# Patient Record
Sex: Female | Born: 1950 | Race: White | Hispanic: No | Marital: Married | Smoking: Never smoker
Health system: Southern US, Community
[De-identification: ages and names within clinical notes are randomized; demographics above are authoritative.]

## PROBLEM LIST (undated history)

## (undated) DIAGNOSIS — R002 Palpitations: Secondary | ICD-10-CM

## (undated) DIAGNOSIS — I10 Essential (primary) hypertension: Secondary | ICD-10-CM

## (undated) DIAGNOSIS — K219 Gastro-esophageal reflux disease without esophagitis: Secondary | ICD-10-CM

## (undated) DIAGNOSIS — H6993 Unspecified Eustachian tube disorder, bilateral: Secondary | ICD-10-CM

## (undated) DIAGNOSIS — H6983 Other specified disorders of Eustachian tube, bilateral: Secondary | ICD-10-CM

## (undated) DIAGNOSIS — T7840XA Allergy, unspecified, initial encounter: Secondary | ICD-10-CM

## (undated) DIAGNOSIS — I6521 Occlusion and stenosis of right carotid artery: Secondary | ICD-10-CM

## (undated) DIAGNOSIS — J45909 Unspecified asthma, uncomplicated: Secondary | ICD-10-CM

## (undated) DIAGNOSIS — E785 Hyperlipidemia, unspecified: Secondary | ICD-10-CM

## (undated) HISTORY — DX: Gastro-esophageal reflux disease without esophagitis: K21.9

## (undated) HISTORY — DX: Other specified disorders of eustachian tube, bilateral: H69.83

## (undated) HISTORY — DX: Unspecified eustachian tube disorder, bilateral: H69.93

## (undated) HISTORY — DX: Essential (primary) hypertension: I10

## (undated) HISTORY — DX: Unspecified asthma, uncomplicated: J45.909

## (undated) HISTORY — DX: Hyperlipidemia, unspecified: E78.5

## (undated) HISTORY — DX: Palpitations: R00.2

## (undated) HISTORY — DX: Allergy, unspecified, initial encounter: T78.40XA

## (undated) HISTORY — DX: Occlusion and stenosis of right carotid artery: I65.21

---

## 2013-08-22 ENCOUNTER — Other Ambulatory Visit (HOSPITAL_COMMUNITY): Payer: Self-pay | Admitting: Family Medicine

## 2013-08-22 ENCOUNTER — Ambulatory Visit (HOSPITAL_COMMUNITY)
Admission: RE | Admit: 2013-08-22 | Discharge: 2013-08-22 | Disposition: A | Discharge status: Home / Self Care | Payer: 59 | Source: Ambulatory Visit | Attending: Vascular Surgery | Admitting: Vascular Surgery

## 2013-08-22 DIAGNOSIS — I6529 Occlusion and stenosis of unspecified carotid artery: Secondary | ICD-10-CM

## 2013-08-22 DIAGNOSIS — I658 Occlusion and stenosis of other precerebral arteries: Secondary | ICD-10-CM | POA: Insufficient documentation

## 2013-08-23 ENCOUNTER — Other Ambulatory Visit (HOSPITAL_COMMUNITY): Payer: Self-pay | Admitting: Family Medicine

## 2015-09-11 DIAGNOSIS — H6980 Other specified disorders of Eustachian tube, unspecified ear: Secondary | ICD-10-CM | POA: Diagnosis present

## 2015-09-11 DIAGNOSIS — E785 Hyperlipidemia, unspecified: Secondary | ICD-10-CM | POA: Diagnosis present

## 2015-09-11 DIAGNOSIS — J309 Allergic rhinitis, unspecified: Secondary | ICD-10-CM | POA: Diagnosis present

## 2019-02-15 ENCOUNTER — Encounter: Payer: Self-pay | Admitting: Gastroenterology

## 2020-10-02 ENCOUNTER — Ambulatory Visit (AMBULATORY_SURGERY_CENTER): Payer: 59

## 2020-10-02 ENCOUNTER — Other Ambulatory Visit: Payer: Self-pay

## 2020-10-02 VITALS — Ht 62.5 in | Wt 176.0 lb

## 2020-10-02 DIAGNOSIS — Z800 Family history of malignant neoplasm of digestive organs: Secondary | ICD-10-CM

## 2020-10-02 NOTE — Progress Notes (Signed)

## 2020-10-03 ENCOUNTER — Encounter: Payer: Self-pay | Admitting: Gastroenterology

## 2020-10-16 ENCOUNTER — Ambulatory Visit (AMBULATORY_SURGERY_CENTER): Payer: 59 | Admitting: Gastroenterology

## 2020-10-16 ENCOUNTER — Other Ambulatory Visit: Payer: Self-pay

## 2020-10-16 ENCOUNTER — Encounter: Payer: Self-pay | Admitting: Gastroenterology

## 2020-10-16 VITALS — BP 406/57 | HR 74 | Temp 97.1°F | Resp 13 | Ht 62.5 in | Wt 176.0 lb

## 2020-10-16 DIAGNOSIS — Z1211 Encounter for screening for malignant neoplasm of colon: Secondary | ICD-10-CM | POA: Diagnosis not present

## 2020-10-16 DIAGNOSIS — Z800 Family history of malignant neoplasm of digestive organs: Secondary | ICD-10-CM

## 2020-10-16 MED ORDER — SODIUM CHLORIDE 0.9 % IV SOLN: 500.0000 mL | Freq: Once | INTRAVENOUS | Status: DC

## 2020-10-16 NOTE — Progress Notes (Signed)
Pt's states no medical or surgical changes since previsit or office visit.  VS taken by HC 

## 2020-10-16 NOTE — Progress Notes (Signed)
Report to PACU, RN, vss, BBS= Clear.  

## 2020-10-16 NOTE — Op Note (Signed)
[facility] Patient Name: Brenda Morse Procedure Date: 10/16/2020 9:19 AM MRN: [id number] Endoscopist: Lynann Bologna , MD Age: 70 Referring MD:  Date of Birth: 01-05-1951 Gender: Female Account #: [id number] Procedure:                Colonoscopy Indications:              Screening in patient at increased risk: Colorectal                            cancer in brother at age 56. Medicines:                Monitored Anesthesia Care Procedure:                Pre-Anesthesia Assessment:                           - Prior to the procedure, a History and Physical                            was performed, and patient medications and                            allergies were reviewed. The patient's tolerance of                            previous anesthesia was also reviewed. The risks                            and benefits of the procedure and the sedation                            options and risks were discussed with the patient.                            All questions were answered, and informed consent                            was obtained. Prior Anticoagulants: The patient has                            taken no previous anticoagulant or antiplatelet                            agents. ASA Grade Assessment: II - A patient with                            mild systemic disease. After reviewing the risks                            and benefits, the patient was deemed in                            satisfactory condition to undergo the procedure.  After obtaining informed consent, the colonoscope                            was passed under direct vision. Throughout the                            procedure, the patient's blood pressure, pulse, and                            oxygen saturations were monitored continuously. The                            Olympus [id number]-[id number]) Colonoscope was                            introduced through the anus and  advanced to the the                            cecum, identified by appendiceal orifice and                            ileocecal valve. The colonoscopy was performed                            without difficulty. The patient tolerated the                            procedure well. The quality of the bowel                            preparation was good. The ileocecal valve,                            appendiceal orifice, and rectum were photographed. Scope In: 9:26:00 AM Scope Out: 9:39:36 AM Scope Withdrawal Time: 0 hours 9 minutes 40 seconds  Total Procedure Duration: 0 hours 13 minutes 36 seconds  Findings:                 Multiple medium-mouthed diverticula were found in                            the sigmoid colon, descending colon, transverse                            colon, ascending colon and few in cecum.                           A single small angiodysplastic lesion without                            bleeding was found in the cecum.                           Non-bleeding internal hemorrhoids were found during  retroflexion. The hemorrhoids were small.                           The exam was otherwise without abnormality on                            direct and retroflexion views. Complications:            No immediate complications. Estimated Blood Loss:     Estimated blood loss: none. Impression:               - Moderate pancolonic diverticulosis.                           - A single non-bleeding incidental colonic AVM.                           - Non-bleeding internal hemorrhoids.                           - The examination was otherwise normal on direct                            and retroflexion views.                           - No specimens collected. Recommendation:           - Patient has a contact number available for                            emergencies. The signs and symptoms of potential                            delayed complications  were discussed with the                            patient. Return to normal activities tomorrow.                            Written discharge instructions were provided to the                            patient.                           - High fiber diet.                           - Continue present medications.                           - Repeat colonoscopy is not recommended for                            screening purposes. Hence repeat colonoscopy only  if with any new problems or change in family                            history.                           - Return to GI office PRN.                           - The findings and recommendations were discussed                            with patient's friend Vernona Rieger. Lynann Bologna, MD 10/16/2020 9:46:20 AM This report has been signed electronically.

## 2020-10-16 NOTE — Patient Instructions (Signed)
Information on hemorrhoids and diverticulosis given to you today.  Resume previous diet and medications and be sure to eat plenty of fiber.  YOU HAD AN ENDOSCOPIC PROCEDURE TODAY AT THE [facility]:   Refer to the procedure report that was given to you for any specific questions about what was found during the examination.  If the procedure report does not answer your questions, please call your gastroenterologist to clarify.  If you requested that your care partner not be given the details of your procedure findings, then the procedure report has been included in a sealed envelope for you to review at your convenience later.  YOU SHOULD EXPECT: Some feelings of bloating in the abdomen. Passage of more gas than usual.  Walking can help get rid of the air that was put into your GI tract during the procedure and reduce the bloating. If you had a lower endoscopy (such as a colonoscopy or flexible sigmoidoscopy) you may notice spotting of blood in your stool or on the toilet paper. If you underwent a bowel prep for your procedure, you may not have a normal bowel movement for a few days.  Please Note:  You might notice some irritation and congestion in your nose or some drainage.  This is from the oxygen used during your procedure.  There is no need for concern and it should clear up in a day or so.  SYMPTOMS TO REPORT IMMEDIATELY:   Following lower endoscopy (colonoscopy or flexible sigmoidoscopy):  Excessive amounts of blood in the stool  Significant tenderness or worsening of abdominal pains  Swelling of the abdomen that is new, acute  Fever of 100F or higher  For urgent or emergent issues, a gastroenterologist can be reached at any hour by calling [phone]. Do not use MyChart messaging for urgent concerns.    DIET:  We do recommend a small meal at first, but then you may proceed to your regular diet.  Drink plenty of fluids but you should avoid alcoholic beverages for 24  hours.  ACTIVITY:  You should plan to take it easy for the rest of today and you should NOT DRIVE or use heavy machinery until tomorrow (because of the sedation medicines used during the test).    FOLLOW UP: Our staff will call the number listed on your records 48-72 hours following your procedure to check on you and address any questions or concerns that you may have regarding the information given to you following your procedure. If we do not reach you, we will leave a message.  We will attempt to reach you two times.  During this call, we will ask if you have developed any symptoms of COVID 19. If you develop any symptoms (ie: fever, flu-like symptoms, shortness of breath, cough etc.) before then, please call [phone].  If you test positive for Covid 19 in the 2 weeks post procedure, please call and report this information to [place].    If any biopsies were taken you will be contacted by phone or by letter within the next 1-3 weeks.  Please call us at [phone] if you have not heard about the biopsies in 3 weeks.    SIGNATURES/CONFIDENTIALITY: You and/or your care partner have signed paperwork which will be entered into your electronic medical record.  These signatures attest to the fact that that the information above on your After Visit Summary has been reviewed and is understood.  Full responsibility of the confidentiality of this discharge information lies  with you and/or your care-partner.

## 2020-10-20 ENCOUNTER — Telehealth: Payer: Self-pay

## 2020-10-20 NOTE — Telephone Encounter (Signed)
LVM

## 2021-01-07 ENCOUNTER — Telehealth: Payer: Self-pay | Admitting: Gastroenterology

## 2021-01-07 NOTE — Telephone Encounter (Signed)
Patient calling to inform she is experiencing diarrhea. Pt states she has been having sxs for 2 weeks. Pt wants to know how to treat sxs.. Plz advise   Thanks

## 2021-01-07 NOTE — Telephone Encounter (Signed)
Not too sure what is going on She is not a complainer -Lets check stool studies for GI pathogens, C. difficile and calprotectin -Check CBC, CMP, CRP, celiac serology and TSH -Can she be seen in app clinic or my clinic, whichever we can get her sooner RG

## 2021-01-07 NOTE — Telephone Encounter (Signed)
Pt states she has been having diarrhea for the past 2 weeks. She is taking pepto and imodium, has no appetite, has acid reflux coming up in her throat and reports she feels terrible. States she has not been on any antibiotics recently. Please advise.

## 2021-01-08 ENCOUNTER — Encounter (HOSPITAL_COMMUNITY): Payer: Self-pay | Admitting: Internal Medicine

## 2021-01-08 ENCOUNTER — Inpatient Hospital Stay (HOSPITAL_COMMUNITY)
Admission: AD | Admit: 2021-01-08 | Discharge: 2021-01-15 | DRG: 871 | Disposition: A | Discharge status: Home Health | Payer: 59 | Source: Other Acute Inpatient Hospital | Attending: Internal Medicine | Admitting: Internal Medicine

## 2021-01-08 ENCOUNTER — Inpatient Hospital Stay: Admission: AD | Admit: 2021-01-08 | Payer: 59 | Source: Other Acute Inpatient Hospital | Admitting: Internal Medicine

## 2021-01-08 ENCOUNTER — Other Ambulatory Visit: Payer: Self-pay

## 2021-01-08 DIAGNOSIS — Z20822 Contact with and (suspected) exposure to covid-19: Secondary | ICD-10-CM | POA: Diagnosis present

## 2021-01-08 DIAGNOSIS — I081 Rheumatic disorders of both mitral and tricuspid valves: Secondary | ICD-10-CM | POA: Diagnosis present

## 2021-01-08 DIAGNOSIS — A0472 Enterocolitis due to Clostridium difficile, not specified as recurrent: Secondary | ICD-10-CM | POA: Diagnosis present

## 2021-01-08 DIAGNOSIS — Z90711 Acquired absence of uterus with remaining cervical stump: Secondary | ICD-10-CM | POA: Diagnosis not present

## 2021-01-08 DIAGNOSIS — R160 Hepatomegaly, not elsewhere classified: Secondary | ICD-10-CM | POA: Diagnosis present

## 2021-01-08 DIAGNOSIS — K750 Abscess of liver: Secondary | ICD-10-CM | POA: Diagnosis present

## 2021-01-08 DIAGNOSIS — Z79899 Other long term (current) drug therapy: Secondary | ICD-10-CM | POA: Diagnosis not present

## 2021-01-08 DIAGNOSIS — N179 Acute kidney failure, unspecified: Secondary | ICD-10-CM | POA: Diagnosis present

## 2021-01-08 DIAGNOSIS — I2721 Secondary pulmonary arterial hypertension: Secondary | ICD-10-CM | POA: Diagnosis present

## 2021-01-08 DIAGNOSIS — I10 Essential (primary) hypertension: Secondary | ICD-10-CM | POA: Diagnosis present

## 2021-01-08 DIAGNOSIS — G250 Essential tremor: Secondary | ICD-10-CM | POA: Diagnosis present

## 2021-01-08 DIAGNOSIS — H6990 Unspecified Eustachian tube disorder, unspecified ear: Secondary | ICD-10-CM | POA: Diagnosis present

## 2021-01-08 DIAGNOSIS — G9341 Metabolic encephalopathy: Secondary | ICD-10-CM | POA: Diagnosis present

## 2021-01-08 DIAGNOSIS — Z8711 Personal history of peptic ulcer disease: Secondary | ICD-10-CM | POA: Diagnosis not present

## 2021-01-08 DIAGNOSIS — R7989 Other specified abnormal findings of blood chemistry: Secondary | ICD-10-CM | POA: Diagnosis present

## 2021-01-08 DIAGNOSIS — R197 Diarrhea, unspecified: Secondary | ICD-10-CM

## 2021-01-08 DIAGNOSIS — K219 Gastro-esophageal reflux disease without esophagitis: Secondary | ICD-10-CM | POA: Diagnosis present

## 2021-01-08 DIAGNOSIS — Z880 Allergy status to penicillin: Secondary | ICD-10-CM | POA: Diagnosis not present

## 2021-01-08 DIAGNOSIS — R7881 Bacteremia: Secondary | ICD-10-CM | POA: Diagnosis not present

## 2021-01-08 DIAGNOSIS — Z800 Family history of malignant neoplasm of digestive organs: Secondary | ICD-10-CM

## 2021-01-08 DIAGNOSIS — R4182 Altered mental status, unspecified: Secondary | ICD-10-CM | POA: Diagnosis not present

## 2021-01-08 DIAGNOSIS — H6980 Other specified disorders of Eustachian tube, unspecified ear: Secondary | ICD-10-CM | POA: Diagnosis present

## 2021-01-08 DIAGNOSIS — E871 Hypo-osmolality and hyponatremia: Secondary | ICD-10-CM | POA: Diagnosis present

## 2021-01-08 DIAGNOSIS — J309 Allergic rhinitis, unspecified: Secondary | ICD-10-CM | POA: Diagnosis present

## 2021-01-08 DIAGNOSIS — A408 Other streptococcal sepsis: Principal | ICD-10-CM | POA: Diagnosis present

## 2021-01-08 DIAGNOSIS — E785 Hyperlipidemia, unspecified: Secondary | ICD-10-CM | POA: Diagnosis present

## 2021-01-08 DIAGNOSIS — I340 Nonrheumatic mitral (valve) insufficiency: Secondary | ICD-10-CM | POA: Diagnosis not present

## 2021-01-08 DIAGNOSIS — I361 Nonrheumatic tricuspid (valve) insufficiency: Secondary | ICD-10-CM | POA: Diagnosis not present

## 2021-01-08 LAB — CBC
HCT: 29.3 % — ABNORMAL LOW (ref 36.0–46.0)
Hemoglobin: 10.2 g/dL — ABNORMAL LOW (ref 12.0–15.0)
MCH: 28.7 pg (ref 26.0–34.0)
MCHC: 34.8 g/dL (ref 30.0–36.0)
MCV: 82.3 fL (ref 80.0–100.0)
Platelets: 141 10*3/uL — ABNORMAL LOW (ref 150–400)
RBC: 3.56 MIL/uL — ABNORMAL LOW (ref 3.87–5.11)
RDW: 13.2 % (ref 11.5–15.5)
WBC: 17.8 10*3/uL — ABNORMAL HIGH (ref 4.0–10.5)
nRBC: 0 % (ref 0.0–0.2)

## 2021-01-08 LAB — CREATININE, SERUM
Creatinine, Ser: 2.56 mg/dL — ABNORMAL HIGH (ref 0.44–1.00)
GFR, Estimated: 20 mL/min — ABNORMAL LOW (ref >60)

## 2021-01-08 MED ORDER — VANCOMYCIN HCL 1250 MG/250ML IV SOLN
1250.0000 mg | Freq: Once | INTRAVENOUS | Status: AC
  Administered 2021-01-08: 1250 mg via INTRAVENOUS
  Filled 2021-01-08: qty 250

## 2021-01-08 MED ORDER — ONDANSETRON HCL 4 MG PO TABS: 4.0000 mg | ORAL_TABLET | Freq: Four times a day (QID) | ORAL | Status: DC | PRN

## 2021-01-08 MED ORDER — MORPHINE SULFATE (PF) 2 MG/ML IV SOLN: 2.0000 mg | INTRAVENOUS | Status: DC | PRN

## 2021-01-08 MED ORDER — ENOXAPARIN SODIUM 30 MG/0.3ML IJ SOSY
30.0000 mg | PREFILLED_SYRINGE | INTRAMUSCULAR | Status: DC
  Administered 2021-01-09 – 2021-01-10 (×2): 30 mg via SUBCUTANEOUS
  Filled 2021-01-08 (×2): qty 0.3

## 2021-01-08 MED ORDER — SODIUM CHLORIDE 0.45 % IV SOLN
INTRAVENOUS | Status: DC
  Administered 2021-01-08: 100 mL via INTRAVENOUS

## 2021-01-08 MED ORDER — SODIUM CHLORIDE 0.9 % IV SOLN
2.0000 g | INTRAVENOUS | Status: AC
  Administered 2021-01-09: 2 g via INTRAVENOUS
  Filled 2021-01-08 (×2): qty 2

## 2021-01-08 MED ORDER — ONDANSETRON HCL 4 MG/2ML IJ SOLN
4.0000 mg | Freq: Four times a day (QID) | INTRAMUSCULAR | Status: DC | PRN
  Administered 2021-01-13 – 2021-01-15 (×4): 4 mg via INTRAVENOUS
  Filled 2021-01-08 (×5): qty 2

## 2021-01-08 NOTE — Telephone Encounter (Signed)
Called to speak to pt and her friend answered the phone and said pt was in the bathroom. Reports she has not been able to eat much of anything and she is shaking all over. She is going to take her to [facility] ER to be evaluated. Dr. Chales Abrahams notified.

## 2021-01-08 NOTE — H&P (Signed)
History and Physical   Brenda Morse CBS:[id number] DOB: 02/16/1951 DOA: 01/08/2021  Referring MD/NP/PA: Dr. Genevie Cheshire, from [place]  PCP: Etter Sjogren Joline Salt, PA-C   Outpatient Specialists: Dr. Chales Abrahams, gastroenterologist  Patient coming from: [facility]  Chief Complaint: Diarrhea and fever  HPI: Brenda Morse is a 70 y.o. female with medical history significant of  significant of diverticular disease, GERD, Hashimoto's thyroiditis, thrombocytopenia, essential tremor, chronic interstitial cystitis, history of gastric ulcer and gastritis, irritable bowel syndrome, B12 deficiency, who was seen at [facility] with acute vasculopathy sepsis elevated for work-up.  Patient was seen by medical service.  Consultation.  She apparently has been having profuse diarrhea up to 40 times a day with nausea but no vomiting.  This been going on for over 2 weeks.  Denied any significant abdominal pain.  No urinary symptoms.  Patient had colonoscopy about 2 and half months ago by Dr. Chales Abrahams Nothing was seen at the time except for diverticular disease.  But she came in today with fever and chills.  COVID-19 was negative.  Patient was evaluated and found to have alert several centimeter mass in the liver with another small liver mass.  Also elevated LFTs concerning for abscess versus malignancy.  Patient had a white count of 23,000.  Temperature 1.3.  She also had urinalysis that is nondiagnostic.  Lactic acid was 1.3 magnesium 2.1.  Tylenol level was negative.  Her AST was 30-55 ALT 1481 total bilirubin 2.3.  She is also in acute kidney injury with creatinine 2.10 glucose 163 BUN 27.  Her hemoglobin was found to be 10.9.  Based on her findings patient is suspected to have liver abscess.  She was therefore transferred here for further evaluation and treatment.  She has underlying history of depression as well but appears stable here.  She reported no diarrhea since arrival here at [facility] long.  Also no nausea  or vomiting at this point.  Patient is being admitted with expected GI follow-up..  ED Course: Refer to notes above  Review of Systems: As per HPI otherwise 10 point review of systems negative.    Past Medical History:  Diagnosis Date   Allergy    Asthma    when allergies flare   GERD (gastroesophageal reflux disease)    Hypertension     Past Surgical History:  Procedure Laterality Date   PARTIAL HYSTERECTOMY  1986   TONSILLECTOMY AND ADENOIDECTOMY Bilateral    age 56     reports that she has never smoked. She has never used smokeless tobacco. She reports previous alcohol use. She reports that she does not use drugs.  Allergies  Allergen Reactions   Penicillins Itching    Family History  Problem Relation Age of Onset   Colon cancer Brother 79   Rectal cancer Brother    Esophageal cancer Neg Hx    Stomach cancer Neg Hx      Prior to Admission medications   Medication Sig Start Date End Date Taking? Authorizing Provider  atenolol (TENORMIN) 25 MG tablet Take 1 tablet by mouth daily. 07/10/20   [provider]  cetirizine (ZYRTEC) 10 MG tablet Take by mouth.    [provider]  DM-APAP-CPM (CORICIDIN HBP) 10-325-2 MG TABS Take by mouth.    [provider]  fluticasone (FLONASE) 50 MCG/ACT nasal spray Place into the nose.    [provider]  hydrochlorothiazide (HYDRODIURIL) 12.5 MG tablet Take 1 tablet by mouth daily. 07/10/20   [provider]  montelukast (SINGULAIR) 10 MG tablet TAKE 1 TABLET BY MOUTH EVERY DAY AT NIGHT 07/10/20   [provider]  Multiple Vitamin (MULTIVITAMIN ADULT PO) Take by mouth.    [provider]    Physical Exam: Vitals:   01/08/21 2025  BP: (!) 107/58  Pulse: 70  Resp: 16  Temp: 97.6 F (36.4 C)  TempSrc: Oral  SpO2: 93%      Constitutional: NAD, calm, comfortable Vitals:   01/08/21 2025  BP: (!) 107/58  Pulse: 70  Resp: 16  Temp: 97.6 F (36.4 C)  TempSrc:  Oral  SpO2: 93%   Eyes: PERRL, lids and conjunctivae normal ENMT: Mucous membranes are moist. Posterior pharynx clear of any exudate or lesions.Normal dentition.  Neck: normal, supple, no masses, no thyromegaly Respiratory: clear to auscultation bilaterally, no wheezing, no crackles. Normal respiratory effort. No accessory muscle use.  Cardiovascular: Regular rate and rhythm, no murmurs / rubs / gallops. No extremity edema. 2+ pedal pulses. No carotid bruits.  Abdomen: Mild epigastric tenderness, no masses palpated. No hepatosplenomegaly. Bowel sounds positive.  Musculoskeletal: no clubbing / cyanosis. No joint deformity upper and lower extremities. Good ROM, no contractures. Normal muscle tone.  Skin: no rashes, lesions, ulcers. No induration Neurologic: CN 2-12 grossly intact. Sensation intact, DTR normal. Strength 5/5 in all 4.  Psychiatric: Normal judgment and insight. Alert and oriented x 3. Normal mood.     Labs on Admission: I have personally reviewed following labs and imaging studies  CBC: No results for input(s): WBC, NEUTROABS, HGB, HCT, MCV, PLT in the last 168 hours. Basic Metabolic Panel: No results for input(s): NA, K, CL, CO2, GLUCOSE, BUN, CREATININE, CALCIUM, MG, PHOS in the last 168 hours. GFR: CrCl cannot be calculated (No successful lab value found.). Liver Function Tests: No results for input(s): AST, ALT, ALKPHOS, BILITOT, PROT, ALBUMIN in the last 168 hours. No results for input(s): LIPASE, AMYLASE in the last 168 hours. No results for input(s): AMMONIA in the last 168 hours. Coagulation Profile: No results for input(s): INR, PROTIME in the last 168 hours. Cardiac Enzymes: No results for input(s): CKTOTAL, CKMB, CKMBINDEX, TROPONINI in the last 168 hours. BNP (last 3 results) No results for input(s): PROBNP in the last 8760 hours. HbA1C: No results for input(s): HGBA1C in the last 72 hours. CBG: No results for input(s): GLUCAP in the last 168 hours. Lipid  Profile: No results for input(s): CHOL, HDL, LDLCALC, TRIG, CHOLHDL, LDLDIRECT in the last 72 hours. Thyroid Function Tests: No results for input(s): TSH, T4TOTAL, FREET4, T3FREE, THYROIDAB in the last 72 hours. Anemia Panel: No results for input(s): VITAMINB12, FOLATE, FERRITIN, TIBC, IRON, RETICCTPCT in the last 72 hours. Urine analysis: No results found for: COLORURINE, APPEARANCEUR, LABSPEC, PHURINE, GLUCOSEU, HGBUR, BILIRUBINUR, KETONESUR, PROTEINUR, UROBILINOGEN, NITRITE, LEUKOCYTESUR Sepsis Labs: @LABRCNTIP (procalcitonin:4,lacticidven:4) )No results found for this or any previous visit (from the past 240 hour(s)).   Radiological Exams on Admission: No results found.    Assessment/Plan Principal Problem:   Liver mass Active Problems:   Allergic rhinitis   ETD (eustachian tube dysfunction)   Hyperlipidemia     #1 suspected liver abscess versus mass: Patient transferred with a suspicion of liver abscess especially with a fever and leukocytosis.  Differentials very.  GI was consulted from [place].  We will initiate empiric Vanco and cefepime.  She probably will need something like Flagyl.  Ultimately further imaging is required including possible MR or CT-guided aspiration versus biopsy.  In the meantime continue empiric antibiotics.  Blood  cultures already obtained and will follow results.  #2 hyperlipidemia: Hold her statin and other medicines due to ongoing liver disease.  #3 Allergic Rhinitis: Patient takes Zyrtec at home.  We will continue.  #4 essential hypertension: On atenolol and hydrochlorothiazide.  Continue the atenolol  #5 history of ETD: Continue with the cetirizine, Afrin, Singulair   DVT prophylaxis: Lovenox Code Status: Full code Family Communication: No family at bedside Disposition Plan: Home Consults called: Consult Dr. Chales Abrahams in the morning for GI Admission status: Inpatient  Severity of Illness: The appropriate patient status for this patient is  INPATIENT. Inpatient status is judged to be reasonable and necessary in order to provide the required intensity of service to ensure the patient's safety. The patient's presenting symptoms, physical exam findings, and initial radiographic and laboratory data in the context of their chronic comorbidities is felt to place them at high risk for further clinical deterioration. Furthermore, it is not anticipated that the patient will be medically stable for discharge from the hospital within 2 midnights of admission. The following factors support the patient status of inpatient.   " The patient's presenting symptoms include diarrhea. " The worrisome physical exam findings include mild epigastric tenderness. " The initial radiographic and laboratory data are worrisome because of about 7 cm mass in the liver. " The chronic co-morbidities include essential hypertension.   * I certify that at the point of admission it is my clinical judgment that the patient will require inpatient hospital care spanning beyond 2 midnights from the point of admission due to high intensity of service, high risk for further deterioration and high frequency of surveillance required.Lonia Blood MD [facility] Pager [phone]  If 7PM-7AM, please contact night-coverage [email or web address] Password [facility]  01/08/2021, 8:39 PM

## 2021-01-08 NOTE — Telephone Encounter (Signed)
Agree with plan RG 

## 2021-01-09 ENCOUNTER — Inpatient Hospital Stay (HOSPITAL_COMMUNITY): Payer: 59

## 2021-01-09 DIAGNOSIS — R160 Hepatomegaly, not elsewhere classified: Secondary | ICD-10-CM

## 2021-01-09 LAB — BASIC METABOLIC PANEL
Anion gap: 16 — ABNORMAL HIGH (ref 5–15)
Anion gap: 11 (ref 5–15)
BUN: 41 mg/dL — ABNORMAL HIGH (ref 8–23)
BUN: 41 mg/dL — ABNORMAL HIGH (ref 8–23)
CO2: 22 mmol/L (ref 22–32)
CO2: 20 mmol/L — ABNORMAL LOW (ref 22–32)
Calcium: 7.5 mg/dL — ABNORMAL LOW (ref 8.9–10.3)
Calcium: 7.4 mg/dL — ABNORMAL LOW (ref 8.9–10.3)
Chloride: 88 mmol/L — ABNORMAL LOW (ref 98–111)
Chloride: 98 mmol/L (ref 98–111)
Creatinine, Ser: 2.68 mg/dL — ABNORMAL HIGH (ref 0.44–1.00)
Creatinine, Ser: 2.02 mg/dL — ABNORMAL HIGH (ref 0.44–1.00)
GFR, Estimated: 19 mL/min — ABNORMAL LOW (ref >60)
GFR, Estimated: 26 mL/min — ABNORMAL LOW (ref >60)
Glucose, Bld: 84 mg/dL (ref 70–99)
Glucose, Bld: 174 mg/dL — ABNORMAL HIGH (ref 70–99)
Potassium: 2.9 mmol/L — ABNORMAL LOW (ref 3.5–5.1)
Potassium: 3.3 mmol/L — ABNORMAL LOW (ref 3.5–5.1)
Sodium: 126 mmol/L — ABNORMAL LOW (ref 135–145)
Sodium: 129 mmol/L — ABNORMAL LOW (ref 135–145)

## 2021-01-09 LAB — AMMONIA: Ammonia: 23 umol/L (ref 9–35)

## 2021-01-09 LAB — MRSA PCR SCREENING: MRSA by PCR: NEGATIVE

## 2021-01-09 LAB — COMPREHENSIVE METABOLIC PANEL
ALT: 897 U/L — ABNORMAL HIGH (ref 0–44)
AST: 795 U/L — ABNORMAL HIGH (ref 15–41)
Albumin: 2.4 g/dL — ABNORMAL LOW (ref 3.5–5.0)
Alkaline Phosphatase: 118 U/L (ref 38–126)
Anion gap: 16 — ABNORMAL HIGH (ref 5–15)
BUN: 42 mg/dL — ABNORMAL HIGH (ref 8–23)
CO2: 19 mmol/L — ABNORMAL LOW (ref 22–32)
Calcium: 7.5 mg/dL — ABNORMAL LOW (ref 8.9–10.3)
Chloride: 91 mmol/L — ABNORMAL LOW (ref 98–111)
Creatinine, Ser: 2.87 mg/dL — ABNORMAL HIGH (ref 0.44–1.00)
GFR, Estimated: 17 mL/min — ABNORMAL LOW (ref >60)
Glucose, Bld: 96 mg/dL (ref 70–99)
Potassium: 3.2 mmol/L — ABNORMAL LOW (ref 3.5–5.1)
Sodium: 126 mmol/L — ABNORMAL LOW (ref 135–145)
Total Bilirubin: 1.7 mg/dL — ABNORMAL HIGH (ref 0.3–1.2)
Total Protein: 5.8 g/dL — ABNORMAL LOW (ref 6.5–8.1)

## 2021-01-09 LAB — CBC
HCT: 33.2 % — ABNORMAL LOW (ref 36.0–46.0)
Hemoglobin: 11.6 g/dL — ABNORMAL LOW (ref 12.0–15.0)
MCH: 28.8 pg (ref 26.0–34.0)
MCHC: 34.9 g/dL (ref 30.0–36.0)
MCV: 82.4 fL (ref 80.0–100.0)
Platelets: 153 10*3/uL (ref 150–400)
RBC: 4.03 MIL/uL (ref 3.87–5.11)
RDW: 13.4 % (ref 11.5–15.5)
WBC: 18.3 10*3/uL — ABNORMAL HIGH (ref 4.0–10.5)
nRBC: 0 % (ref 0.0–0.2)

## 2021-01-09 LAB — HIV ANTIBODY (ROUTINE TESTING W REFLEX): HIV Screen 4th Generation wRfx: NONREACTIVE

## 2021-01-09 LAB — OSMOLALITY: Osmolality: 269 mOsm/kg — ABNORMAL LOW (ref 275–295)

## 2021-01-09 LAB — PROCALCITONIN: Procalcitonin: >150 ng/mL

## 2021-01-09 LAB — HEPATITIS PANEL, ACUTE
HCV Ab: NONREACTIVE
Hep A IgM: NONREACTIVE
Hep B C IgM: NONREACTIVE
Hepatitis B Surface Ag: NONREACTIVE

## 2021-01-09 LAB — SEDIMENTATION RATE: Sed Rate: 72 mm/hr — ABNORMAL HIGH (ref 0–22)

## 2021-01-09 LAB — TSH: TSH: 2.1 u[IU]/mL (ref 0.350–4.500)

## 2021-01-09 LAB — VITAMIN B12: Vitamin B-12: 3549 pg/mL — ABNORMAL HIGH (ref 180–914)

## 2021-01-09 LAB — C-REACTIVE PROTEIN: CRP: 30.2 mg/dL — ABNORMAL HIGH (ref <1.0)

## 2021-01-09 LAB — SODIUM, URINE, RANDOM: Sodium, Ur: <10 mmol/L

## 2021-01-09 LAB — GLUCOSE, CAPILLARY: Glucose-Capillary: 93 mg/dL (ref 70–99)

## 2021-01-09 LAB — PHOSPHORUS: Phosphorus: 3.6 mg/dL (ref 2.5–4.6)

## 2021-01-09 LAB — OSMOLALITY, URINE: Osmolality, Ur: 281 mOsm/kg — ABNORMAL LOW (ref 300–900)

## 2021-01-09 LAB — FOLATE: Folate: 18.4 ng/mL (ref >5.9)

## 2021-01-09 MED ORDER — VANCOMYCIN HCL 125 MG PO CAPS
125.0000 mg | ORAL_CAPSULE | Freq: Four times a day (QID) | ORAL | Status: DC
  Administered 2021-01-09 (×2): 125 mg via ORAL
  Filled 2021-01-09 (×3): qty 1

## 2021-01-09 MED ORDER — POTASSIUM CHLORIDE 10 MEQ/100ML IV SOLN
10.0000 meq | INTRAVENOUS | Status: AC
  Administered 2021-01-09 (×4): 10 meq via INTRAVENOUS
  Filled 2021-01-09 (×4): qty 100

## 2021-01-09 MED ORDER — THIAMINE HCL 100 MG/ML IJ SOLN
500.0000 mg | Freq: Three times a day (TID) | INTRAVENOUS | Status: DC
  Administered 2021-01-09 – 2021-01-10 (×2): 500 mg via INTRAVENOUS
  Filled 2021-01-09 (×4): qty 5

## 2021-01-09 MED ORDER — LACTATED RINGERS IV SOLN: INTRAVENOUS | Status: DC

## 2021-01-09 MED ORDER — CHLORHEXIDINE GLUCONATE CLOTH 2 % EX PADS
6.0000 | MEDICATED_PAD | Freq: Every day | CUTANEOUS | Status: DC
  Administered 2021-01-09 – 2021-01-15 (×7): 6 via TOPICAL

## 2021-01-09 MED ORDER — THIAMINE HCL 100 MG/ML IJ SOLN: 100.0000 mg | Freq: Every day | INTRAMUSCULAR | Status: DC

## 2021-01-09 MED ORDER — VANCOMYCIN VARIABLE DOSE PER UNSTABLE RENAL FUNCTION (PHARMACIST DOSING): Status: DC

## 2021-01-09 MED ORDER — MELATONIN 3 MG PO TABS
3.0000 mg | ORAL_TABLET | Freq: Every day | ORAL | Status: DC
  Administered 2021-01-09 – 2021-01-14 (×7): 3 mg via ORAL
  Filled 2021-01-09 (×8): qty 1

## 2021-01-09 MED ORDER — SODIUM CHLORIDE 0.9 % IV SOLN
2.0000 g | INTRAVENOUS | Status: DC
  Administered 2021-01-09 – 2021-01-10 (×2): 2 g via INTRAVENOUS
  Filled 2021-01-09 (×2): qty 2

## 2021-01-09 MED ORDER — METRONIDAZOLE 500 MG/100ML IV SOLN
500.0000 mg | Freq: Three times a day (TID) | INTRAVENOUS | Status: DC
  Administered 2021-01-09 – 2021-01-14 (×13): 500 mg via INTRAVENOUS
  Filled 2021-01-09 (×13): qty 100

## 2021-01-09 MED ORDER — METRONIDAZOLE 500 MG/100ML IV SOLN
500.0000 mg | Freq: Three times a day (TID) | INTRAVENOUS | Status: DC
  Administered 2021-01-09: 500 mg via INTRAVENOUS
  Filled 2021-01-09: qty 100

## 2021-01-09 MED ORDER — KCL IN DEXTROSE-NACL 20-5-0.9 MEQ/L-%-% IV SOLN
INTRAVENOUS | Status: DC
  Filled 2021-01-09 (×10): qty 1000

## 2021-01-09 MED ORDER — VANCOMYCIN HCL 250 MG PO CAPS
500.0000 mg | ORAL_CAPSULE | Freq: Four times a day (QID) | ORAL | Status: DC
  Administered 2021-01-09 – 2021-01-13 (×17): 500 mg via ORAL
  Filled 2021-01-09 (×21): qty 2

## 2021-01-09 MED ORDER — SODIUM CHLORIDE 0.9 % IV SOLN
100.0000 mg | Freq: Two times a day (BID) | INTRAVENOUS | Status: DC
  Administered 2021-01-09 – 2021-01-15 (×11): 100 mg via INTRAVENOUS
  Filled 2021-01-09 (×14): qty 100

## 2021-01-09 MED ORDER — SODIUM CHLORIDE 0.9 % IV SOLN
100.0000 mg | Freq: Two times a day (BID) | INTRAVENOUS | Status: DC
  Filled 2021-01-09: qty 100

## 2021-01-09 MED ORDER — SODIUM CHLORIDE 0.9 % IV SOLN: 2.0000 g | INTRAVENOUS | Status: DC

## 2021-01-09 NOTE — Plan of Care (Signed)
Pt shows signs of confusion; pt denied past medical history such as Hashimoto disease; she takes several seconds before answering questions; 3 episodes of diarrhea; no pain reported; pt with low temperature and low BP; changes reported to NP. Some mild tremors reported and observed this am on right hand; Call light within reach and bed at lowest position for safety.

## 2021-01-09 NOTE — Consult Note (Signed)
[facility]  Total days of antibiotics 2       Reason for Consult:sepsis/liver mass   Referring Physician: powell  Principal Problem:   Liver mass Active Problems:   Allergic rhinitis   ETD (eustachian tube dysfunction)   Hyperlipidemia    HPI: Madge Therrien is a 70 y.o. female  Marlaina Coburn is a 70 y.o. female past medical history of htn diverticular disease, GERD, Hashimoto's thyroiditis, thrombocytopenia, essential tremor, chronic interstitial cystitis, history of gastric ulcer and gastritis, irritable bowel syndrome, follows dr Chales Abrahams at [facility] - had colonoscopy 3 months ago with no signs of abnormality. She was admitted on 6/16 to [place] with 2 wk history of profuse diarrhea, nausea, and now chills. While at [place] [facility] on 6/16, she had numerous abn. Including leukocytosis of 22K with 30% bands. Serum sodium of 122, Cr 2.10, cl 86. Marked transaminitis with ALT 3255, AST 1481. T.bili 2.3. LA of 1.3. cdifficile PCR was positive. she underwent abd CT imaging that showed 6.3 x 6.2 cm hepatic mass, and other smaller lesions including 1.9 x 2.0. also some inflammation about gallbladder. Some focal nodular wall thickening in sigmoid colon. She was transferred to [facility] for further management, on cefepime, iv metronidazole, oral vanco.  Her labs on day 2 of hospitalization is 17.8K, plt 141, sed rate 72, sodium 126, cr 2.67, procal +++, ast 795, ALT 897.  Some of labs could be explained by marked volume depletion including aki, hyponatremia, hypokalemia. Possibly transaminitis abn due to hypoperfusion but LA was WNL.   Overnight she had a 3 loose stools but during the day shift not having further diarrhea. She is still having confusion. Unable to mention what brings her into the hospital.   Past Medical History:  Diagnosis Date   Allergy    Asthma    when allergies flare   GERD (gastroesophageal reflux disease)    Hypertension     Allergies:  Allergies   Allergen Reactions   Penicillins Itching     MEDICATIONS:  enoxaparin (LOVENOX) injection  30 mg Subcutaneous Q24H   melatonin  3 mg Oral QHS   [START ON 01/12/2021] thiamine injection  100 mg Intravenous Daily   vancomycin  125 mg Oral QID    Social History   Tobacco Use   Smoking status: Never   Smokeless tobacco: Never  Vaping Use   Vaping Use: Never used  Substance Use Topics   Alcohol use: Not Currently   Drug use: Never    Family History  Problem Relation Age of Onset   Colon cancer Brother 21   Rectal cancer Brother    Esophageal cancer Neg Hx    Stomach cancer Neg Hx     Review of Systems -  12 point ros is negative except what is mentioned above with confusion, and gi symptoms of perfuse diarrhea, nausea, poor po intake.  OBJECTIVE: Temp:  [91.4 F (33 C)-97.6 F (36.4 C)] 94.4 F (34.7 C) (06/17 1516) Pulse Rate:  [68-84] 84 (06/17 1516) Resp:  [16-21] 20 (06/17 1516) BP: (94-149)/(57-87) 149/85 (06/17 1516) SpO2:  [93 %-97 %] 97 % (06/17 1516) Weight:  [81.6 kg] 81.6 kg (06/16 2123) Physical Exam  Constitutional:  oriented to person, place, and time. appears well-developed and well-nourished. No distress.  HENT: [place]/AT, PERRLA, no scleral icterus Mouth/Throat: Oropharynx is clear and moist. No oropharyngeal exudate.  Cardiovascular: Normal rate, regular rhythm and normal heart sounds. Exam reveals no gallop and no friction rub.  No murmur heard.  Pulmonary/Chest: Effort normal and breath sounds normal. No respiratory distress.  has no wheezes.  Neck = supple, no nuchal rigidity Abdominal: Soft. Bowel sounds are normal.  exhibits no distension. There is no tenderness.  Lymphadenopathy: no cervical adenopathy. No axillary adenopathy Neurological: alert and oriented to person, place, and time.  Skin: Skin is warm and dry. No rash noted. No erythema.  Psychiatric: a normal mood and affect.  behavior is normal. Non-sequitor speech  LABS: Results for  orders placed or performed during the hospital encounter of 01/08/21 (from the past 48 hour(s))  HIV Antibody (routine testing w rflx)     Status: None   Collection Time: 01/08/21  9:07 PM  Result Value Ref Range   HIV Screen 4th Generation wRfx Non Reactive Non Reactive    Comment: Performed at [facility], [address] [place], [place] [address]  CBC     Status: Abnormal   Collection Time: 01/08/21  9:07 PM  Result Value Ref Range   WBC 17.8 (H) 4.0 - 10.5 K/uL   RBC 3.56 (L) 3.87 - 5.11 MIL/uL   Hemoglobin 10.2 (L) 12.0 - 15.0 g/dL   HCT 60.429.3 (L) 54.036.0 - 98.146.0 %   MCV 82.3 80.0 - 100.0 fL   MCH 28.7 26.0 - 34.0 pg   MCHC 34.8 30.0 - 36.0 g/dL   RDW 19.113.2 47.811.5 - 29.515.5 %   Platelets 141 (L) 150 - 400 K/uL   nRBC 0.0 0.0 - 0.2 %    Comment: Performed at [facility], [address] [place], [place] [address]  Creatinine, serum     Status: Abnormal   Collection Time: 01/08/21  9:07 PM  Result Value Ref Range   Creatinine, Ser 2.56 (H) 0.44 - 1.00 mg/dL   GFR, Estimated 20 (L) >60 mL/min    Comment: (NOTE) Calculated using the CKD-EPI Creatinine Equation (2021) Performed at [facility], [address] [place], [place] [address]   Glucose, capillary     Status: None   Collection Time: 01/09/21  2:31 AM  Result Value Ref Range   Glucose-Capillary 93 70 - 99 mg/dL    Comment: Glucose reference range applies only to samples taken after fasting for at least 8 hours.  Comprehensive metabolic panel     Status: Abnormal   Collection Time: 01/09/21  5:48 AM  Result Value Ref Range   Sodium 126 (L) 135 - 145 mmol/L   Potassium 3.2 (L) 3.5 - 5.1 mmol/L   Chloride 91 (L) 98 - 111 mmol/L   CO2 19 (L) 22 - 32 mmol/L   Glucose, Bld 96 70 - 99 mg/dL    Comment: Glucose reference range applies only to samples taken after fasting for at least 8 hours.   BUN 42 (H) 8 - 23 mg/dL   Creatinine, Ser 4.692.87 (H) 0.44 - 1.00 mg/dL   Calcium 7.5 (L) 8.9 -  10.3 mg/dL   Total Protein 5.8 (L) 6.5 - 8.1 g/dL   Albumin 2.4 (L) 3.5 - 5.0 g/dL   AST 629795 (H) 15 - 41 U/L   ALT 897 (H) 0 - 44 U/L   Alkaline Phosphatase 118 38 - 126 U/L   Total Bilirubin 1.7 (H) 0.3 - 1.2 mg/dL   GFR, Estimated 17 (L) >60 mL/min    Comment: (NOTE) Calculated using the CKD-EPI Creatinine Equation (2021)    Anion gap 16 (H) 5 - 15    Comment: Performed  at [facility], [address] [place], [place] [address]  CBC     Status: Abnormal   Collection Time: 01/09/21  5:48 AM  Result Value Ref Range   WBC 18.3 (H) 4.0 - 10.5 K/uL   RBC 4.03 3.87 - 5.11 MIL/uL   Hemoglobin 11.6 (L) 12.0 - 15.0 g/dL   HCT 95.6 (L) 38.7 - 56.4 %   MCV 82.4 80.0 - 100.0 fL   MCH 28.8 26.0 - 34.0 pg   MCHC 34.9 30.0 - 36.0 g/dL   RDW 33.2 95.1 - 88.4 %   Platelets 153 150 - 400 K/uL   nRBC 0.0 0.0 - 0.2 %    Comment: Performed at [facility], [address] [place], [place] [address]  Procalcitonin - Baseline     Status: None   Collection Time: 01/09/21  9:12 AM  Result Value Ref Range   Procalcitonin >150.00 ng/mL    Comment:        Interpretation: PCT >= 10 ng/mL: Important systemic inflammatory response, almost exclusively due to severe bacterial sepsis or septic shock. (NOTE)       Sepsis PCT Algorithm           Lower Respiratory Tract                                      Infection PCT Algorithm    ----------------------------     ----------------------------         PCT < 0.25 ng/mL                PCT < 0.10 ng/mL          Strongly encourage             Strongly discourage   discontinuation of antibiotics    initiation of antibiotics    ----------------------------     -----------------------------       PCT 0.25 - 0.50 ng/mL            PCT 0.10 - 0.25 ng/mL               OR       >80% decrease in PCT            Discourage initiation of                                            antibiotics      Encourage discontinuation            of antibiotics    ----------------------------     -----------------------------         PCT >= 0.50 ng/mL              PCT 0.26 - 0.50 ng/mL                AND       <80% decrease in PCT             Encourage initiation of                                             antibiotics  Encourage continuation           of antibiotics    ----------------------------     -----------------------------        PCT >= 0.50 ng/mL                  PCT > 0.50 ng/mL               AND         increase in PCT                  Strongly encourage                                      initiation of antibiotics    Strongly encourage escalation           of antibiotics                                     -----------------------------                                           PCT <= 0.25 ng/mL                                                 OR                                        > 80% decrease in PCT                                      Discontinue / Do not initiate                                             antibiotics  Performed at [facility], [address] [place], [place] [address]   Osmolality     Status: Abnormal   Collection Time: 01/09/21  9:12 AM  Result Value Ref Range   Osmolality 269 (L) 275 - 295 mOsm/kg    Comment: Performed at [facility], [address], [place], [place] [address]  Sedimentation rate     Status: Abnormal   Collection Time: 01/09/21  9:12 AM  Result Value Ref Range   Sed Rate 72 (H) 0 - 22 mm/hr    Comment: Performed at [facility], [address], [place], [place] [address]  C-reactive protein     Status: Abnormal   Collection Time: 01/09/21  9:12 AM  Result Value Ref Range   CRP 30.2 (H) <1.0 mg/dL    Comment: Performed at [facility], [address] [place], [place] [address]  TSH     Status: None   Collection Time: 01/09/21  9:12 AM  Result Value Ref Range  TSH 2.100 0.350 -  4.500 uIU/mL    Comment: Performed by a 3rd Generation assay with a functional sensitivity of <=0.01 uIU/mL. Performed at [facility], [address] [place], [place] [address]   Vitamin B12     Status: Abnormal   Collection Time: 01/09/21  9:12 AM  Result Value Ref Range   Vitamin B-12 3,549 (H) 180 - 914 pg/mL    Comment: RESULTS CONFIRMED BY MANUAL DILUTION (NOTE) This assay is not validated for testing neonatal or myeloproliferative syndrome specimens for Vitamin B12 levels. Performed at [facility], [address] [place], [place] [address]   Folate     Status: None   Collection Time: 01/09/21  9:12 AM  Result Value Ref Range   Folate 18.4 >5.9 ng/mL    Comment: Performed at [facility], [address], [place], [place] [address]  Hepatitis panel, acute     Status: None   Collection Time: 01/09/21  9:20 AM  Result Value Ref Range   Hepatitis B Surface Ag NON REACTIVE NON REACTIVE   HCV Ab NON REACTIVE NON REACTIVE    Comment: (NOTE) Nonreactive HCV antibody screen is consistent with no HCV infections,  unless recent infection is suspected or other evidence exists to indicate HCV infection.     Hep A IgM NON REACTIVE NON REACTIVE   Hep B C IgM NON REACTIVE NON REACTIVE    Comment: Performed at [facility], [address], [place], [place] [address]  Ammonia     Status: None   Collection Time: 01/09/21 10:46 AM  Result Value Ref Range   Ammonia 23 9 - 35 umol/L    Comment: Performed at [facility], [address], [place], [place] [address]  Basic metabolic panel     Status: Abnormal   Collection Time: 01/09/21  2:00 PM  Result Value Ref Range   Sodium 126 (L) 135 - 145 mmol/L   Potassium 2.9 (L) 3.5 - 5.1 mmol/L   Chloride 88 (L) 98 - 111 mmol/L   CO2 22 22 - 32 mmol/L   Glucose, Bld 84 70 - 99 mg/dL    Comment: Glucose reference range applies only to samples taken after fasting  for at least 8 hours.   BUN 41 (H) 8 - 23 mg/dL   Creatinine, Ser 7.37 (H) 0.44 - 1.00 mg/dL   Calcium 7.5 (L) 8.9 - 10.3 mg/dL   GFR, Estimated 19 (L) >60 mL/min    Comment: (NOTE) Calculated using the CKD-EPI Creatinine Equation (2021)    Anion gap 16 (H) 5 - 15    Comment: Performed at [facility], [address], [place], [place] [address]    MICRO: reviewed IMAGING: CT HEAD WO CONTRAST  Result Date: 01/09/2021 CLINICAL DATA:  Delirium.  Confusion. EXAM: CT HEAD WITHOUT CONTRAST TECHNIQUE: Contiguous axial images were obtained from the base of the skull through the vertex without intravenous contrast. COMPARISON:  None. FINDINGS: Brain: No acute infarct, hemorrhage, or mass lesion is present. The ventricles are of normal size. No significant extraaxial fluid collection is present. The craniocervical junction is normal. Upper cervical spine is within normal limits. Marrow signal is unremarkable. Vascular: Atherosclerotic calcifications are present within the cavernous internal carotid arteries bilaterally. No hyperdense vessel is present. Skull: Calvarium is intact. No focal lytic or blastic lesions are present. No significant extracranial soft tissue lesion is present. Sinuses/Orbits: The paranasal sinuses and mastoid air cells  are clear. The globes and orbits are within normal limits. IMPRESSION: 1. Normal CT appearance of the brain. 2. Atherosclerosis. Electronically Signed   By: Marin Roberts M.D.   On: 01/09/2021 15:12    HISTORICAL MICRO/IMAGING: reviewed. Cdiff pcr pending.  Assessment/Plan:  69yo admitted for perfuse diarrhea found to have fever, hyponatremia, aki, transaminitis with imaging showing large liver lesions/abscess. Presumably she has not been recently seen for these symptoms until now. She did have a clean colonoscopy roughly 3 months ago.  C.difficile colitis = will treat with oral vancomycin  QID plus IV metronidazole -presumably severe  due to WBC and AKI. CT imaging of sigmoid colon could be related to cdifficile, but would think more inflammation would be noted given time course of 2 wks  Hepatic mass/fluid collection = presuming this is infectious, will treat with Iv cefepime (penicillin allergy noted- unable to clearly discuss with her at this time) and iv metronidazole for the time being  Encephalopathy =recommend MRI of brain unfortunately can't do contrast due to her AKI. May improve as underlying infection is treated. Would watch to not correct her sodium too quickly  Transaminitis = initially noted to be in the 1500-3200 range now down to 800s. Still feel this is quite elevated if due to liver mass. Still could be due to hypoperfusion. Agree with plan to do mri abd with see if can sample fluid/drain.  Will need to follow up blood cx and outside cx to help guide abtx. Empirically on doxycycline since she mentioned had tick exposure. Would see how she improves over the next 24hr to de-escalate.  Duke Salvia Drue Second MD MPH [facility] [phone]

## 2021-01-09 NOTE — Progress Notes (Addendum)
PROGRESS NOTE    Brenda Morse  WCB:[id number] DOB: 21-Mar-1951 DOA: 01/08/2021 PCP: Maggie Schwalbe, PA-C   No chief complaint on file.  Brief Narrative:  Brenda Morse is Brenda Morse 70 y.o. female with medical history significant of  significant of diverticular disease, GERD, Hashimoto's thyroiditis, thrombocytopenia, essential tremor, chronic interstitial cystitis, history of gastric ulcer and gastritis, irritable bowel syndrome, B12 deficiency, who was seen at [facility] with acute vasculopathy sepsis elevated for work-up.  Patient was seen by medical service.  Consultation.  She apparently has been having profuse diarrhea up to 40 times Brenda Morse day with nausea but no vomiting.  This been going on for over 2 weeks.  Denied any significant abdominal pain.  No urinary symptoms.  Patient had colonoscopy about 2 and half months ago by Dr. Lyndel Safe Nothing was seen at the time except for diverticular disease.  But she came in today with fever and chills.  COVID-19 was negative.  Patient was evaluated and found to have alert several centimeter mass in the liver with another small liver mass.  Also elevated LFTs concerning for abscess versus malignancy.  Patient had [name] white count of 23,000.  Temperature 1.3.  She also had urinalysis that is nondiagnostic.  Lactic acid was 1.3 magnesium 2.1.  Tylenol level was negative.  Her AST was 30-55 ALT 1481 total bilirubin 2.3.  She is also in acute kidney injury with creatinine 2.10 glucose 163 BUN 27.  Her hemoglobin was found to be 10.9.  Based on her findings patient is suspected to have liver abscess.  She was therefore transferred here for further evaluation and treatment.  She has underlying history of depression as well but appears stable here.  She reported no diarrhea since arrival here at [facility] long.  Also no nausea or vomiting at this point.  Patient is being admitted with expected GI follow-up..   Assessment & Plan:   Principal Problem:   Liver mass Active  Problems:   Allergic rhinitis   ETD (eustachian tube dysfunction)   Hyperlipidemia  Sepsis with concern for liver abscesses vs metastatic disease Sepsis based on hypothermia, leukocytosis.  Qsofa  1-2/3 with occasionally soft BP and AMS. Suspect infection maybe more likely etiology with leukocytosis, hypothermia, N/V/D prior to presentation CT concerning for hypoattenuating masses in liver and focal nodular wall thickening in sigmoid colon (see below) Follow MRI liver (without contrast given renal function) Continue cefepime, flagyl - will d/c vanc given intraabdominal infection, suspect less likely need MRSA coverage ( was on vanc/imipenem at OSH) Follow GI pathogen panel Follow blood cultures (here and those collected at [place]) Elevated procal, ESR, CRP.  Leukocytosis.  Trend.  ID c/s, appreciate recs Consider GI c/s May need IR for aspiration, will follow results of MRI to better char lesions first  CT from OSH with new multiple hypoattenuating masses in the liver, largest measuring up to 6.3 cm, concerning for metastataic disease or possibly intrahepatic abscesses given the patient's leukocytosis and elevated liver enzymes.  Focal nodular wall thickening in the sigmoid colon with mild adjacent stranding which could represent Brenda Morse colonic malignancy or short segment diverticulitis.  Recommend multiphase contrast enhanced MRI when the patient is stable and can tolerate Khamia Stambaugh breath hold to further evaluate these liver masses.  Nondilated gallbladder with nonspecific wall thickening and non visible radioopaque stones.  Cholecystitis vs hepatocellular disease.  Small hiatal hernia.  Likely postinfectious/postinflammatory scarring and atelectasis in lower lungs, R>L.  Elevated Liver Enzymes Likely related to above Follow acute  hepatitis studies and liver MRI Trend  Acute Kidney Injury  Hyponatremia Likely 2/2 sepsis, hypovolemia, improving with IVF, follow Follow urine studies  Nausea   Vomiting  Diarrhea  Positive C diff antigen, negative toxin, positive PCR With positive c diff antigen and PCR, will treat with oral vanc Likely 2/2 above  Acute Metabolic encephalopathy Follow head CT Normal ammonia, TSH, folate.  Elevated B12. Suspect this is 2/2 infection above.  Delirium precautions W/u further as indicated  Hypertension Holding atenolol and HCTZ for now  Hx Hashimoto's thyroiditis Normal TSH, not on meds  Hx Essential Tremor  Hx Chronic Intersitial Cystitis  Hx Gastric Ulcer  Gastritis  Holding singulair, zyrtec, afrin for now  DVT prophylaxis:lovenox Code Status: full  Family Communication: (none at bedside - called sister no answer Disposition:   Status is: Inpatient  Remains inpatient appropriate because:Inpatient level of care appropriate due to severity of illness  Dispo: The patient is from: Home              Anticipated d/c is to: Home              Patient currently is not medically stable to d/c.   Difficult to place patient No       Consultants:  ID  Procedures:  none  Antimicrobials:  Anti-infectives (From admission, onward)    Start     Dose/Rate Route Frequency Ordered Stop   01/10/21 0000  ceFEPIme (MAXIPIME) 2 g in sodium chloride 0.9 % 100 mL IVPB        2 g 200 mL/hr over 30 Minutes Intravenous Every 24 hours 01/09/21 0037     01/09/21 1015  vancomycin (VANCOCIN) capsule 125 mg        125 mg Oral 4 times daily 01/09/21 0928 01/19/21 0959   01/09/21 0800  metroNIDAZOLE (FLAGYL) IVPB 500 mg        500 mg 100 mL/hr over 60 Minutes Intravenous Every 8 hours 01/09/21 0744     01/09/21 0009  vancomycin variable dose per unstable renal function (pharmacist dosing)  Status:  Discontinued         Does not apply See admin instructions 01/09/21 0009 01/09/21 1147   01/08/21 2230  vancomycin (VANCOREADY) IVPB 1250 mg/250 mL        1,250 mg 166.7 mL/hr over 90 Minutes Intravenous  Once 01/08/21 2121 01/08/21 2354   01/08/21  2200  ceFEPIme (MAXIPIME) 2 g in sodium chloride 0.9 % 100 mL IVPB        2 g 200 mL/hr over 30 Minutes Intravenous NOW 01/08/21 2117 01/09/21 0042       Subjective: Sluggish, slow to respond, but Brenda Morse&Ox3 Denies pain, discomfort  Objective: Vitals:   01/09/21 0312 01/09/21 0531 01/09/21 1022 01/09/21 1204  BP:  (!) 94/57 127/87 125/73  Pulse:  79 73 74  Resp:  16 20 (!) 21  Temp:  (!) 93.9 F (34.4 C) (!) 94.4 F (34.7 C) (!) 94 F (34.4 C)  TempSrc:  Oral    SpO2:  97% 94% 94%  Weight:      Height: _0  (1.575 m)       Intake/Output Summary (Last 24 hours) at 01/09/2021 1405 Last data filed at 01/09/2021 0012 Gross per 24 hour  Intake 350 ml  Output --  Net 350 ml   Filed Weights   01/08/21 2123  Weight: 81.6 kg    Examination:  General: No acute distress. Cardiovascular: Heart sounds show Preslynn Bier regular  rate, and rhythm.  Lungs: Clear to auscultation bilaterally  Abdomen: Soft, nontender, nondistended  Neurological: Alert and oriented 3. Moves all extremities 4 . Cranial nerves II through XII grossly intact. Skin: Warm and dry. No rashes or lesions. Extremities: No clubbing or cyanosis. No edema.     Data Reviewed: I have personally reviewed following labs and imaging studies  CBC: Recent Labs  Lab 01/08/21 2107 01/09/21 0548  WBC 17.8* 18.3*  HGB 10.2* 11.6*  HCT 29.3* 33.2*  MCV 82.3 82.4  PLT 141* 323    Basic Metabolic Panel: Recent Labs  Lab 01/08/21 2107 01/09/21 0548  NA  --  126*  K  --  3.2*  CL  --  91*  CO2  --  19*  GLUCOSE  --  96  BUN  --  42*  CREATININE 2.56* 2.87*  CALCIUM  --  7.5*    GFR: Estimated Creatinine Clearance: 18.1 mL/min (Michaeleen Down) (by C-G formula based on SCr of 2.87 mg/dL (H)).  Liver Function Tests: Recent Labs  Lab 01/09/21 0548  AST 795*  ALT 897*  ALKPHOS 118  BILITOT 1.7*  PROT 5.8*  ALBUMIN 2.4*    CBG: Recent Labs  Lab 01/09/21 0231  GLUCAP 93     No results found for this or any  previous visit (from the past 240 hour(s)).       Radiology Studies: No results found.      Scheduled Meds:  enoxaparin (LOVENOX) injection  30 mg Subcutaneous Q24H   melatonin  3 mg Oral QHS   vancomycin  125 mg Oral QID   Continuous Infusions:  [START ON 01/10/2021] ceFEPime (MAXIPIME) IV     lactated ringers 100 mL/hr at 01/09/21 0827   metronidazole 500 mg (01/09/21 0827)     LOS: 1 day    Time spent: over 30 min    Fayrene Helper, MD [facility]   To contact the attending provider between 7A-7P or the covering provider during after hours 7P-7A, please log into the web site [email or web address] and access using universal [place] password for that web site. If you do not have the password, please call the hospital operator.  01/09/2021, 2:05 PM

## 2021-01-09 NOTE — Progress Notes (Signed)
According to RN, unable to get a hold of family members for consent for MRI and to determine if pt has any metal implants. May need head to toe CT if unable to verify metal implants Will hold off on imaging till attending can address in am.   Audrea Muscat, NP [facility] 7p-7a [phone]

## 2021-01-09 NOTE — Plan of Care (Signed)
Discussed with patient plan of care for the evening, pain management, IV access and medications with no evidence of learning at this time.  Problem: Education: Goal: Knowledge of General Education information will improve Description: Including pain rating scale, medication(s)/side effects and non-pharmacologic comfort measures Outcome: Progressing   Problem: Health Behavior/Discharge Planning: Goal: Ability to manage health-related needs will improve Outcome: Progressing

## 2021-01-09 NOTE — Progress Notes (Signed)
Pharmacy Antibiotic Note  Brenda Morse is a 70 y.o. female admitted on 01/08/2021 with medical history significant of  significant of diverticular disease, GERD, Hashimoto's thyroiditis, thrombocytopenia, essential tremor, chronic interstitial cystitis, history of gastric ulcer and gastritis, irritable bowel syndrome, B12 deficiency.  PT has been having profuse diarrhea with nausea.  Based on findings pt is suspected to have liver abscess.  Pharmacy has been consulted for vancomycin and cefepime dosing.  Plan: Vancomycin 1250mg  IV x 1 consider check level in 24-48 hours due to Scr 2.56 Cefepime 2gm IV q24h Follow renal function, cultures and clinical course  Weight: 81.6 kg (179 lb 14.3 oz)  Temp (24hrs), Avg:97.6 F (36.4 C), Min:97.6 F (36.4 C), Max:97.6 F (36.4 C)  Recent Labs  Lab 01/08/21 2107  WBC 17.8*  CREATININE 2.56*    Estimated Creatinine Clearance: 20.5 mL/min (A) (by C-G formula based on SCr of 2.56 mg/dL (H)).    Allergies  Allergen Reactions   Penicillins Itching    Antimicrobials this admission: 6/16 vanc >> 6/17 cefepime >> Dose adjustments this admission:   Microbiology results:  Thank you for allowing pharmacy to be a part of this patient's care.  7/17 RPh 01/09/2021, 12:08 AM

## 2021-01-10 ENCOUNTER — Inpatient Hospital Stay (HOSPITAL_COMMUNITY): Payer: 59

## 2021-01-10 LAB — COMPREHENSIVE METABOLIC PANEL
ALT: 642 U/L — ABNORMAL HIGH (ref 0–44)
AST: 279 U/L — ABNORMAL HIGH (ref 15–41)
Albumin: 2.3 g/dL — ABNORMAL LOW (ref 3.5–5.0)
Alkaline Phosphatase: 139 U/L — ABNORMAL HIGH (ref 38–126)
Anion gap: 10 (ref 5–15)
BUN: 38 mg/dL — ABNORMAL HIGH (ref 8–23)
CO2: 21 mmol/L — ABNORMAL LOW (ref 22–32)
Calcium: 7.5 mg/dL — ABNORMAL LOW (ref 8.9–10.3)
Chloride: 101 mmol/L (ref 98–111)
Creatinine, Ser: 1.72 mg/dL — ABNORMAL HIGH (ref 0.44–1.00)
GFR, Estimated: 32 mL/min — ABNORMAL LOW (ref >60)
Glucose, Bld: 133 mg/dL — ABNORMAL HIGH (ref 70–99)
Potassium: 3 mmol/L — ABNORMAL LOW (ref 3.5–5.1)
Sodium: 132 mmol/L — ABNORMAL LOW (ref 135–145)
Total Bilirubin: 1 mg/dL (ref 0.3–1.2)
Total Protein: 5.8 g/dL — ABNORMAL LOW (ref 6.5–8.1)

## 2021-01-10 LAB — CBC WITH DIFFERENTIAL/PLATELET
Band Neutrophils: 12 %
Basophils Relative: 0 %
Blasts: NONE SEEN %
Eosinophils Relative: 1 %
HCT: 32.7 % — ABNORMAL LOW (ref 36.0–46.0)
Hemoglobin: 11.2 g/dL — ABNORMAL LOW (ref 12.0–15.0)
Lymphocytes Relative: 4 %
MCH: 27.9 pg (ref 26.0–34.0)
MCHC: 34.3 g/dL (ref 30.0–36.0)
MCV: 81.5 fL (ref 80.0–100.0)
Metamyelocytes Relative: NONE SEEN %
Monocytes Relative: 7 %
Myelocytes: NONE SEEN %
Neutrophils Relative %: 76 %
Platelets: 197 10*3/uL (ref 150–400)
Promyelocytes Relative: NONE SEEN %
RBC Morphology: NORMAL
RBC: 4.01 MIL/uL (ref 3.87–5.11)
RDW: 13.4 % (ref 11.5–15.5)
WBC: 23.8 10*3/uL — ABNORMAL HIGH (ref 4.0–10.5)
nRBC: 0 % (ref 0.0–0.2)
nRBC: NONE SEEN /100 WBC

## 2021-01-10 LAB — PROCALCITONIN: Procalcitonin: >150 ng/mL

## 2021-01-10 LAB — MAGNESIUM: Magnesium: 2.3 mg/dL (ref 1.7–2.4)

## 2021-01-10 LAB — C-REACTIVE PROTEIN: CRP: 27.5 mg/dL — ABNORMAL HIGH (ref <1.0)

## 2021-01-10 MED ORDER — POTASSIUM CHLORIDE 20 MEQ PO PACK
40.0000 meq | PACK | Freq: Once | ORAL | Status: AC
  Administered 2021-01-10: 40 meq via ORAL
  Filled 2021-01-10: qty 2

## 2021-01-10 MED ORDER — THIAMINE HCL 100 MG/ML IJ SOLN
100.0000 mg | Freq: Every day | INTRAMUSCULAR | Status: DC
  Administered 2021-01-13 – 2021-01-14 (×2): 100 mg via INTRAVENOUS
  Filled 2021-01-10 (×4): qty 2

## 2021-01-10 MED ORDER — THIAMINE HCL 100 MG/ML IJ SOLN
500.0000 mg | Freq: Three times a day (TID) | INTRAVENOUS | Status: AC
  Administered 2021-01-10 – 2021-01-12 (×7): 500 mg via INTRAVENOUS
  Filled 2021-01-10 (×7): qty 5

## 2021-01-10 NOTE — Plan of Care (Signed)
Discussed with family and patient plan of care for the evening, pain management and night time snacks with some teach back diplayed.   Problem: Education: Goal: Knowledge of General Education information will improve Description: Including pain rating scale, medication(s)/side effects and non-pharmacologic comfort measures 01/10/2021 1927 by Miki Kins, RN Outcome: Progressing 01/10/2021 0800 by Miki Kins, RN Outcome: Progressing

## 2021-01-10 NOTE — Plan of Care (Signed)
  Discussed with patient plan of care for the shift, pain management and bed alarms with some teach back displayed  Problem: Education: Goal: Knowledge of General Education information will improve Description: Including pain rating scale, medication(s)/side effects and non-pharmacologic comfort measures 01/10/2021 0800 by Miki Kins, RN Outcome: Progressing

## 2021-01-10 NOTE — Progress Notes (Addendum)
PROGRESS NOTE    Brenda Morse  WIO:[id number] DOB: 25-Aug-1950 DOA: 01/08/2021 PCP: Maggie Schwalbe, PA-C   No chief complaint on file.  Brief Narrative:  Brenda Morse is Brenda Morse 70 y.o. female with medical history significant of  significant of diverticular disease, GERD, Hashimoto's thyroiditis, thrombocytopenia, essential tremor, chronic interstitial cystitis, history of gastric ulcer and gastritis, irritable bowel syndrome, B12 deficiency, who was seen at [facility] with acute vasculopathy sepsis elevated for work-up.  Patient was seen by medical service.  Consultation.  She apparently has been having profuse diarrhea up to 40 times Leara Rawl day with nausea but no vomiting.  This been going on for over 2 weeks.  Denied any significant abdominal pain.  No urinary symptoms.  Patient had colonoscopy about 2 and half months ago by Dr. Lyndel Safe Nothing was seen at the time except for diverticular disease.  But she came in today with fever and chills.  COVID-19 was negative.  Patient was evaluated and found to have alert several centimeter mass in the liver with another small liver mass.  Also elevated LFTs concerning for abscess versus malignancy.  Patient had Severo Beber white count of 23,000.  Temperature 1.3.  She also had urinalysis that is nondiagnostic.  Lactic acid was 1.3 magnesium 2.1.  Tylenol level was negative.  Her AST was 30-55 ALT 1481 total bilirubin 2.3.  She is also in acute kidney injury with creatinine 2.10 glucose 163 BUN 27.  Her hemoglobin was found to be 10.9.  Based on her findings patient is suspected to have liver abscess.  She was therefore transferred here for further evaluation and treatment.  She has underlying history of depression as well but appears stable here.  She reported no diarrhea since arrival here at [facility] long.  Also no nausea or vomiting at this point.  Patient is being admitted with expected GI follow-up..   Assessment & Plan:   Principal Problem:   Liver mass Active  Problems:   Allergic rhinitis   ETD (eustachian tube dysfunction)   Hyperlipidemia  Sepsis with concern for liver abscesses vs metastatic disease Sepsis based on hypothermia, leukocytosis.  Qsofa  1-2/3 with occasionally soft BP and AMS. Suspect infection maybe more likely etiology with leukocytosis, hypothermia, N/V/D prior to presentation CT concerning for hypoattenuating masses in liver and focal nodular wall thickening in sigmoid colon (see below) Follow MRI liver (without contrast given renal function) - pending Continue cefepime, flagyl - will d/c vanc given intraabdominal infection, suspect less likely need MRSA coverage ( was on vanc/imipenem at OSH) Follow GI pathogen panel Follow blood cultures (here and those collected at [place]) Elevated procal, ESR, CRP.  Leukocytosis.  Trend.  ID c/s, appreciate recs Consider GI c/s May need IR for aspiration, will follow results of MRI to better char lesions first - discussed with Dr. Vernard Gambles from IR, planning for CT guided aspiration/biopsy/drain tomorrow - NPO at midnight  CT from OSH with new multiple hypoattenuating masses in the liver, largest measuring up to 6.3 cm, concerning for metastataic disease or possibly intrahepatic abscesses given the patient's leukocytosis and elevated liver enzymes.  Focal nodular wall thickening in the sigmoid colon with mild adjacent stranding which could represent Jermar Colter colonic malignancy or short segment diverticulitis.  Recommend multiphase contrast enhanced MRI when the patient is stable and can tolerate Quanasia Defino breath hold to further evaluate these liver masses.  Nondilated gallbladder with nonspecific wall thickening and non visible radioopaque stones.  Cholecystitis vs hepatocellular disease.  Small hiatal hernia.  Likely postinfectious/postinflammatory scarring and atelectasis in lower lungs, R>L.  Elevated Liver Enzymes Likely related to above - gradually improving Follow acute hepatitis studies and liver  MRI Trend  Concern for tick borne illness Doxy  Acute Kidney Injury  Hyponatremia Likely 2/2 sepsis, hypovolemia, improving with IVF, follow Follow urine studies - suggest na avid state - continue IVF  Nausea  Vomiting  Diarrhea  Positive C diff antigen, negative toxin, positive PCR With positive c diff antigen and PCR, will treat with oral vanc Likely 2/2 above Treating for severe c diff, appreciate ID recs (500 qid vanc + flagyl)  Acute Metabolic encephalopathy lethargic Follow head CT Normal ammonia, TSH, folate.  Elevated B12. Suspect this is 2/2 infection above.  Delirium precautions Follow MRI   Hypertension Holding atenolol and HCTZ for now  Hx Hashimoto's thyroiditis Normal TSH, not on meds  Hx Essential Tremor  Hx Chronic Intersitial Cystitis  Hx Gastric Ulcer  Gastritis  Holding singulair, zyrtec, afrin for now  DVT prophylaxis:lovenox Code Status: full  Family Communication: (none at bedside - called sister no answer Disposition:   Status is: Inpatient  Remains inpatient appropriate because:Inpatient level of care appropriate due to severity of illness  Dispo: The patient is from: Home              Anticipated d/c is to: Home              Patient currently is not medically stable to d/c.   Difficult to place patient No       Consultants:  ID  Procedures:  none  Antimicrobials:  Anti-infectives (From admission, onward)    Start     Dose/Rate Route Frequency Ordered Stop   01/10/21 0000  ceFEPIme (MAXIPIME) 2 g in sodium chloride 0.9 % 100 mL IVPB  Status:  Discontinued        2 g 200 mL/hr over 30 Minutes Intravenous Every 24 hours 01/09/21 0037 01/09/21 1547   01/09/21 2200  ceFEPIme (MAXIPIME) 2 g in sodium chloride 0.9 % 100 mL IVPB        2 g 200 mL/hr over 30 Minutes Intravenous Every 24 hours 01/09/21 1628     01/09/21 1800  vancomycin (VANCOCIN) capsule 500 mg        500 mg Oral 4 times daily 01/09/21 1631     01/09/21  1700  doxycycline (VIBRAMYCIN) 100 mg in sodium chloride 0.9 % 250 mL IVPB        100 mg 125 mL/hr over 120 Minutes Intravenous Every 12 hours 01/09/21 1547     01/09/21 1700  metroNIDAZOLE (FLAGYL) IVPB 500 mg        500 mg 100 mL/hr over 60 Minutes Intravenous Every 8 hours 01/09/21 1609     01/09/21 1545  doxycycline (VIBRAMYCIN) 100 mg in sodium chloride 0.9 % 250 mL IVPB  Status:  Discontinued        100 mg 125 mL/hr over 120 Minutes Intravenous 2 times daily 01/09/21 1528 01/09/21 1535   01/09/21 1015  vancomycin (VANCOCIN) capsule 125 mg  Status:  Discontinued        125 mg Oral 4 times daily 01/09/21 0928 01/09/21 1631   01/09/21 0800  metroNIDAZOLE (FLAGYL) IVPB 500 mg  Status:  Discontinued        500 mg 100 mL/hr over 60 Minutes Intravenous Every 8 hours 01/09/21 0744 01/09/21 1547   01/09/21 0009  vancomycin variable dose per unstable renal function (pharmacist dosing)  Status:  Discontinued         Does not apply See admin instructions 01/09/21 0009 01/09/21 1147   01/08/21 2230  vancomycin (VANCOREADY) IVPB 1250 mg/250 mL        1,250 mg 166.7 mL/hr over 90 Minutes Intravenous  Once 01/08/21 2121 01/09/21 1741   01/08/21 2200  ceFEPIme (MAXIPIME) 2 g in sodium chloride 0.9 % 100 mL IVPB        2 g 200 mL/hr over 30 Minutes Intravenous NOW 01/08/21 2117 01/09/21 1605       Subjective: Slower to respond today, says her name Mostly attends and smiles  Objective: Vitals:   01/10/21 1000 01/10/21 1100 01/10/21 1224 01/10/21 1300  BP: 107/67 (!) 86/53    Pulse: 97 83    Resp: (!) 36 (!) 31    Temp:   98.4 F (36.9 C)   TempSrc:   Oral   SpO2: 94% 92%  97%  Weight:      Height:        Intake/Output Summary (Last 24 hours) at 01/10/2021 1422 Last data filed at 01/10/2021 1230 Gross per 24 hour  Intake 3606.57 ml  Output 400 ml  Net 3206.57 ml   Filed Weights   01/08/21 2123 01/10/21 0400  Weight: 81.6 kg 82.8 kg    Examination:  General: No acute  distress. Cardiovascular: RRR Lungs: unlabored, CTAB Abdomen: Soft, nontender, nondistended Neurological: looks at me and tracks, smiles, doesn't say much today, can say her name.  Moves all extremities 4 . Cranial nerves II through XII grossly intact. Extremities: No clubbing or cyanosis. No edema.  Data Reviewed: I have personally reviewed following labs and imaging studies  CBC: Recent Labs  Lab 01/08/21 2107 01/09/21 0548 01/10/21 0252  WBC 17.8* 18.3* 23.8*  HGB 10.2* 11.6* 11.2*  HCT 29.3* 33.2* 32.7*  MCV 82.3 82.4 81.5  PLT 141* 153 174    Basic Metabolic Panel: Recent Labs  Lab 01/08/21 2107 01/09/21 0548 01/09/21 1400 01/09/21 2128 01/10/21 0252  NA  --  126* 126* 129* 132*  K  --  3.2* 2.9* 3.3* 3.0*  CL  --  91* 88* 98 101  CO2  --  19* 22 20* 21*  GLUCOSE  --  96 84 174* 133*  BUN  --  42* 41* 41* 38*  CREATININE 2.56* 2.87* 2.68* 2.02* 1.72*  CALCIUM  --  7.5* 7.5* 7.4* 7.5*  MG  --   --   --   --  2.3  PHOS  --   --   --  3.6  --     GFR: Estimated Creatinine Clearance: 30.4 mL/min (Carl Bleecker) (by C-G formula based on SCr of 1.72 mg/dL (H)).  Liver Function Tests: Recent Labs  Lab 01/09/21 0548 01/10/21 0252  AST 795* 279*  ALT 897* 642*  ALKPHOS 118 139*  BILITOT 1.7* 1.0  PROT 5.8* 5.8*  ALBUMIN 2.4* 2.3*    CBG: Recent Labs  Lab 01/09/21 0231  GLUCAP 93     Recent Results (from the past 240 hour(s))  MRSA PCR Screening     Status: None   Collection Time: 01/09/21  4:08 PM  Result Value Ref Range Status   MRSA by PCR NEGATIVE NEGATIVE Final    Comment:        The GeneXpert MRSA Assay (FDA approved for NASAL specimens only), is one component of Mahdi Frye comprehensive MRSA colonization surveillance program. It is not intended to diagnose MRSA infection nor to guide or  monitor treatment for MRSA infections. Performed at [facility], [place] [address] [place], [place] [address]          Radiology Studies: CT  HEAD WO CONTRAST  Result Date: 01/09/2021 CLINICAL DATA:  Delirium.  Confusion. EXAM: CT HEAD WITHOUT CONTRAST TECHNIQUE: Contiguous axial images were obtained from the base of the skull through the vertex without intravenous contrast. COMPARISON:  None. FINDINGS: Brain: No acute infarct, hemorrhage, or mass lesion is present. The ventricles are of normal size. No significant extraaxial fluid collection is present. The craniocervical junction is normal. Upper cervical spine is within normal limits. Marrow signal is unremarkable. Vascular: Atherosclerotic calcifications are present within the cavernous internal carotid arteries bilaterally. No hyperdense vessel is present. Skull: Calvarium is intact. No focal lytic or blastic lesions are present. No significant extracranial soft tissue lesion is present. Sinuses/Orbits: The paranasal sinuses and mastoid air cells are clear. The globes and orbits are within normal limits. IMPRESSION: 1. Normal CT appearance of the brain. 2. Atherosclerosis. Electronically Signed   By: San Morelle M.D.   On: 01/09/2021 15:12        Scheduled Meds:  Chlorhexidine Gluconate Cloth  6 each Topical Daily   enoxaparin (LOVENOX) injection  30 mg Subcutaneous Q24H   melatonin  3 mg Oral QHS   [START ON 01/13/2021] thiamine injection  100 mg Intravenous Daily   vancomycin  500 mg Oral QID   Continuous Infusions:  ceFEPime (MAXIPIME) IV Stopped (01/09/21 2235)   dextrose 5 % and 0.9 % NaCl with KCl 20 mEq/L 100 mL/hr at 01/10/21 1230   doxycycline (VIBRAMYCIN) IV Stopped (01/10/21 0601)   metronidazole Stopped (01/10/21 0854)   thiamine injection Stopped (01/10/21 0910)     LOS: 2 days    Time spent: over 30 min    Fayrene Helper, MD [facility]   To contact the attending provider between 7A-7P or the covering provider during after hours 7P-7A, please log into the web site [email or web address] and access using universal [place] password for that web  site. If you do not have the password, please call the hospital operator.  01/10/2021, 2:22 PM

## 2021-01-10 NOTE — Progress Notes (Addendum)
[facility] Infectious Diseases Follow Up Note  Patient Identification: Patient Name: Brenda Morse MRN: [id number] Admit Date: 01/08/2021  7:53 PM Age: 70 y.o.Today's Date: 01/10/2021   Reason for Visit: Follow-up on C. Difficile, leukocytosis  Principal Problem:   Liver mass Active Problems:   Allergic rhinitis   ETD (eustachian tube dysfunction)   Hyperlipidemia   Antibiotics: Cefepime 6/16-current                    doxycycline 6/17-current                     metronidazole 6/17-current                     vancomycin 6/17-current  Lines/Tubes: External urethral catheter, PIV's  Interval Events: Continues to be afebrile, WBC went up from 18-23.  MRI brain and abdomen is pending   Assessment Liver mass vs abscess vs both  AKI Transaminitis Hyponatremia Severe C. difficile diarrhea- Only 1 loose BM today  ? Penicillin allergy: Patient denies having any h/o allergy about penicillin to me. She appears to be alert and oriented to me.   Blood cx 6/16 at [facility], confirmed with Microbiology  1 set: anaerobic bottle: Gram stain gpc in clusters, gpc in chains, staph epi, strep spp  at 1700 2 set: anaerobic bottle gpc in chains strep spp. 1713hrs   Recommendations Switch cefepime to ceftriaxone given above positive blood cultures. Staph epi is likely a contaminant here. Continue Metronidazole  Continue Doxycycline given h/o tick exposure Fu MRI brain and MRI Liver.   Fu repeat blood cultures 6/17 and final blood cultures from [place] ( ID and sensitivities of streptococcus spp has been requested today) TTE  Continue PO Vancomycin and IV Metronidazole Monitor CBC and CMP on IV antibiotics  Following   Rest of the management as per the primary team. Thank you for the consult. Please page with pertinent questions or  concerns.  ______________________________________________________________________ Subjective patient seen and examined at the bedside. Denies any allergy to penicllin.  Only 1 loose BM today. Denies nausea, vomiting and abdominal pain.   Vitals BP 121/63   Pulse (!) 103   Temp 98.5 F (36.9 C) (Oral)   Resp (!) 32   Ht 5\' 2"  (1.575 m)   Wt 82.8 kg   SpO2 (!) 86%   BMI 33.39 kg/m     Physical Exam Constitutional: Not in acute distress, nursing is changing her closing    Comments:   Cardiovascular:     Rate and Rhythm: Normal rate and regular rhythm.     Heart sounds:   Pulmonary:     Effort: Pulmonary effort is normal.     Comments: Bilateral clear air entry  Abdominal:     Palpations: Abdomen is soft.     Tenderness: Nontender and nondistended  Musculoskeletal:        General: No swelling or tenderness.   Skin:    Comments: No lesions or rashes  Neurological:     General: No focal deficit present.   Psychiatric:        Mood and Affect: Mood normal.   Pertinent Microbiology Results for orders placed or performed during the hospital encounter of 01/08/21  MRSA PCR Screening     Status: None   Collection Time: 01/09/21  4:08 PM  Result Value Ref Range Status   MRSA by PCR NEGATIVE NEGATIVE Final    Comment:  The GeneXpert MRSA Assay (FDA approved for NASAL specimens only), is one component of a comprehensive MRSA colonization surveillance program. It is not intended to diagnose MRSA infection nor to guide or monitor treatment for MRSA infections. Performed at [facility], [place], [address] [place], [place] [address]     Pertinent Lab. CBC Latest Ref Rng & Units 01/10/2021 01/09/2021 01/08/2021  WBC 4.0 - 10.5 K/uL 23.8(H) 18.3(H) 17.8(H)  Hemoglobin 12.0 - 15.0 g/dL 11.2(L) 11.6(L) 10.2(L)  Hematocrit 36.0 - 46.0 % 32.7(L) 33.2(L) 29.3(L)  Platelets 150 - 400 K/uL 197 153 141(L)   CMP Latest Ref Rng & Units 01/10/2021  01/09/2021 01/09/2021  Glucose 70 - 99 mg/dL 646(O) 032(Z) 84  BUN 8 - 23 mg/dL 22(Q) 82(N) 00(B)  Creatinine 0.44 - 1.00 mg/dL 7.04(U) 8.89(V) 6.94(H)  Sodium 135 - 145 mmol/L 132(L) 129(L) 126(L)  Potassium 3.5 - 5.1 mmol/L 3.0(L) 3.3(L) 2.9(L)  Chloride 98 - 111 mmol/L 101 98 88(L)  CO2 22 - 32 mmol/L 21(L) 20(L) 22  Calcium 8.9 - 10.3 mg/dL 7.5(L) 7.4(L) 7.5(L)  Total Protein 6.5 - 8.1 g/dL 0.3(U) - -  Total Bilirubin 0.3 - 1.2 mg/dL 1.0 - -  Alkaline Phos 38 - 126 U/L 139(H) - -  AST 15 - 41 U/L 279(H) - -  ALT 0 - 44 U/L 642(H) - -     Pertinent Imaging today Plain films and CT images have been personally visualized and interpreted; radiology reports have been reviewed. Decision making incorporated into the Impression / Recommendations.  I have spent more than 35 minutes for this patient encounter including review of prior medical records, coordination of care  with greater than 50% of time being face to face/counseling and discussing diagnostics/treatment plan with the patient/family.  Electronically signed by:   Odette Fraction, MD Infectious Disease Physician [employer] [facility] for Infectious Disease Pager: [phone]

## 2021-01-11 ENCOUNTER — Inpatient Hospital Stay (HOSPITAL_COMMUNITY): Payer: 59

## 2021-01-11 DIAGNOSIS — R7881 Bacteremia: Secondary | ICD-10-CM | POA: Diagnosis not present

## 2021-01-11 LAB — CBC WITH DIFFERENTIAL/PLATELET
Abs Immature Granulocytes: 0.99 10*3/uL — ABNORMAL HIGH (ref 0.00–0.07)
Basophils Absolute: 0.2 10*3/uL — ABNORMAL HIGH (ref 0.0–0.1)
Basophils Relative: 1 %
Eosinophils Absolute: 0.2 10*3/uL (ref 0.0–0.5)
Eosinophils Relative: 1 %
HCT: 31.8 % — ABNORMAL LOW (ref 36.0–46.0)
Hemoglobin: 10.6 g/dL — ABNORMAL LOW (ref 12.0–15.0)
Immature Granulocytes: 5 %
Lymphocytes Relative: 5 %
Lymphs Abs: 1.1 10*3/uL (ref 0.7–4.0)
MCH: 28.3 pg (ref 26.0–34.0)
MCHC: 33.3 g/dL (ref 30.0–36.0)
MCV: 84.8 fL (ref 80.0–100.0)
Monocytes Absolute: 0.9 10*3/uL (ref 0.1–1.0)
Monocytes Relative: 4 %
Neutro Abs: 18.4 10*3/uL — ABNORMAL HIGH (ref 1.7–7.7)
Neutrophils Relative %: 84 %
Platelets: 217 10*3/uL (ref 150–400)
RBC: 3.75 MIL/uL — ABNORMAL LOW (ref 3.87–5.11)
RDW: 14.1 % (ref 11.5–15.5)
WBC: 21.7 10*3/uL — ABNORMAL HIGH (ref 4.0–10.5)
nRBC: 0 % (ref 0.0–0.2)

## 2021-01-11 LAB — COMPREHENSIVE METABOLIC PANEL
ALT: 356 U/L — ABNORMAL HIGH (ref 0–44)
AST: 124 U/L — ABNORMAL HIGH (ref 15–41)
Albumin: 2 g/dL — ABNORMAL LOW (ref 3.5–5.0)
Alkaline Phosphatase: 124 U/L (ref 38–126)
Anion gap: 5 (ref 5–15)
BUN: 19 mg/dL (ref 8–23)
CO2: 20 mmol/L — ABNORMAL LOW (ref 22–32)
Calcium: 7.5 mg/dL — ABNORMAL LOW (ref 8.9–10.3)
Chloride: 111 mmol/L (ref 98–111)
Creatinine, Ser: 0.94 mg/dL (ref 0.44–1.00)
GFR, Estimated: >60 mL/min (ref >60)
Glucose, Bld: 98 mg/dL (ref 70–99)
Potassium: 3.8 mmol/L (ref 3.5–5.1)
Sodium: 136 mmol/L (ref 135–145)
Total Bilirubin: 0.4 mg/dL (ref 0.3–1.2)
Total Protein: 4.9 g/dL — ABNORMAL LOW (ref 6.5–8.1)

## 2021-01-11 LAB — GASTROINTESTINAL PANEL BY PCR, STOOL (REPLACES STOOL CULTURE)

## 2021-01-11 LAB — ECHOCARDIOGRAM COMPLETE
Area-P 1/2: 5.27 cm2
Height: 62 in
S' Lateral: 3.3 cm
Weight: 2955.93 oz

## 2021-01-11 LAB — PROCALCITONIN: Procalcitonin: 72.46 ng/mL

## 2021-01-11 MED ORDER — FLUMAZENIL 0.5 MG/5ML IV SOLN
INTRAVENOUS | Status: AC
  Filled 2021-01-11: qty 5

## 2021-01-11 MED ORDER — GADOBUTROL 1 MMOL/ML IV SOLN
8.0000 mL | Freq: Once | INTRAVENOUS | Status: AC | PRN
  Administered 2021-01-11: 8 mL via INTRAVENOUS

## 2021-01-11 MED ORDER — SODIUM CHLORIDE 0.9 % IV SOLN
INTRAVENOUS | Status: AC
  Filled 2021-01-11: qty 250

## 2021-01-11 MED ORDER — MIDAZOLAM HCL 2 MG/2ML IJ SOLN
INTRAMUSCULAR | Status: AC | PRN
  Administered 2021-01-11 (×2): 1 mg via INTRAVENOUS

## 2021-01-11 MED ORDER — MIDAZOLAM HCL 2 MG/2ML IJ SOLN
INTRAMUSCULAR | Status: AC
  Filled 2021-01-11: qty 4

## 2021-01-11 MED ORDER — MORPHINE SULFATE (PF) 2 MG/ML IV SOLN: 2.0000 mg | INTRAVENOUS | Status: DC | PRN

## 2021-01-11 MED ORDER — SODIUM CHLORIDE 0.9 % IV SOLN
2.0000 g | Freq: Every day | INTRAVENOUS | Status: DC
  Administered 2021-01-11 – 2021-01-13 (×3): 2 g via INTRAVENOUS
  Filled 2021-01-11 (×2): qty 2
  Filled 2021-01-11: qty 20
  Filled 2021-01-11: qty 2
  Filled 2021-01-11: qty 20

## 2021-01-11 MED ORDER — FENTANYL CITRATE (PF) 100 MCG/2ML IJ SOLN
INTRAMUSCULAR | Status: AC | PRN
  Administered 2021-01-11 (×2): 50 ug via INTRAVENOUS

## 2021-01-11 MED ORDER — HYDROCODONE-ACETAMINOPHEN 5-325 MG PO TABS: 1.0000 | ORAL_TABLET | ORAL | Status: DC | PRN

## 2021-01-11 MED ORDER — LIDOCAINE HCL 1 % IJ SOLN
INTRAMUSCULAR | Status: AC | PRN
Start: 2021-01-11 — End: 2021-01-11
  Administered 2021-01-11: 10 mL via INTRADERMAL

## 2021-01-11 MED ORDER — SODIUM CHLORIDE 0.9% FLUSH
5.0000 mL | Freq: Three times a day (TID) | INTRAVENOUS | Status: DC
  Administered 2021-01-11 – 2021-01-15 (×9): 5 mL

## 2021-01-11 MED ORDER — GUAIFENESIN-DM 100-10 MG/5ML PO SYRP
5.0000 mL | ORAL_SOLUTION | ORAL | Status: DC | PRN
  Administered 2021-01-11 (×2): 5 mL via ORAL
  Filled 2021-01-11: qty 5
  Filled 2021-01-11: qty 10
  Filled 2021-01-11 (×2): qty 5

## 2021-01-11 MED ORDER — NALOXONE HCL 0.4 MG/ML IJ SOLN
INTRAMUSCULAR | Status: AC
  Filled 2021-01-11: qty 1

## 2021-01-11 MED ORDER — FENTANYL CITRATE (PF) 100 MCG/2ML IJ SOLN
INTRAMUSCULAR | Status: AC
  Filled 2021-01-11: qty 4

## 2021-01-11 NOTE — Progress Notes (Signed)
Chief Complaint: Patient was seen in consultation today for hepatic abscess   Referring Physician(s): Dr. Lacretia Nicks  Supervising Physician: Oley Balm  Patient Status: [facility] - In-pt  History of Present Illness: Brenda Morse is a 70 y.o. female admitted with abdominal pain, diarrhea and leukocytosis. Initial imaging showed lesions in liver concerning for tumor vs abscess, however follow up MR with contrast shows these lesions to be more consistent with abscess, which is also more fitting with her clinical story. IR is asked to place perc drain. PMHx, meds, labs, imaging, allergies reviewed. Has been NPO today as directed. Family at bedside.   Past Medical History:  Diagnosis Date  . Allergy   . Asthma    when allergies flare  . GERD (gastroesophageal reflux disease)   . Hypertension     Past Surgical History:  Procedure Laterality Date  . PARTIAL HYSTERECTOMY  1986  . TONSILLECTOMY AND ADENOIDECTOMY Bilateral    age 6    Allergies: Penicillins  Medications:  Current Facility-Administered Medications:  .  cefTRIAXone (ROCEPHIN) 2 g in sodium chloride 0.9 % 100 mL IVPB, 2 g, Intravenous, Daily, Zigmund Daniel., MD, Last Rate: 200 mL/hr at 01/11/21 1100, Infusion Verify at 01/11/21 1100 .  Chlorhexidine Gluconate Cloth 2 % PADS 6 each, 6 each, Topical, Daily, Zigmund Daniel., MD, 6 each at 01/11/21 0200 .  dextrose 5 % and 0.9 % NaCl with KCl 20 mEq/L infusion, , Intravenous, Continuous, Zigmund Daniel., MD, Stopped at 01/11/21 1058 .  doxycycline (VIBRAMYCIN) 100 mg in sodium chloride 0.9 % 250 mL IVPB, 100 mg, Intravenous, Q12H, Judyann Munson, MD, Stopped at 01/11/21 4765 .  guaiFENesin-dextromethorphan (ROBITUSSIN DM) 100-10 MG/5ML syrup 5 mL, 5 mL, Oral, Q4H PRN, Zigmund Daniel., MD, 5 mL at 01/11/21 0404 .  melatonin tablet 3 mg, 3 mg, Oral, QHS, Garba, Mohammad L, MD, 3 mg at 01/10/21 2112 .  metroNIDAZOLE (FLAGYL) IVPB  500 mg, 500 mg, Intravenous, Q8H, Judyann Munson, MD, Stopped at 01/11/21 1030 .  morphine 2 MG/ML injection 2 mg, 2 mg, Intravenous, Q4H PRN, Zigmund Daniel., MD .  ondansetron [facility]) tablet 4 mg, 4 mg, Oral, Q6H PRN **OR** ondansetron (ZOFRAN) injection 4 mg, 4 mg, Intravenous, Q6H PRN, Mikeal Hawthorne, Mohammad L, MD .  thiamine 500mg  in normal saline (47ml) IVPB, 500 mg, Intravenous, Q8H, Last Rate: 100 mL/hr at 01/11/21 1100, Infusion Verify at 01/11/21 1100 **FOLLOWED BY** [START ON 01/13/2021] thiamine (B-1) injection 100 mg, 100 mg, Intravenous, Daily, 01/15/2021., MD .  vancomycin (VANCOCIN) capsule 500 mg, 500 mg, Oral, QID, Zigmund Daniel, MD, 500 mg at 01/11/21 01/13/21    Family History  Problem Relation Age of Onset  . Colon cancer Brother 67  . Rectal cancer Brother   . Esophageal cancer Neg Hx   . Stomach cancer Neg Hx     Social History   Socioeconomic History  . Marital status: Married    Spouse name: Not on file  . Number of children: Not on file  . Years of education: Not on file  . Highest education level: Not on file  Occupational History  . Not on file  Tobacco Use  . Smoking status: Never  . Smokeless tobacco: Never  Vaping Use  . Vaping Use: Never used  Substance and Sexual Activity  . Alcohol use: Not Currently  . Drug use: Never  . Sexual activity: Not on file  Other Topics Concern  .  Not on file  Social History Narrative  . Not on file   Social Determinants of Health   Financial Resource Strain: Not on file  Food Insecurity: Not on file  Transportation Needs: Not on file  Physical Activity: Not on file  Stress: Not on file  Social Connections: Not on file    Review of Systems: A 12 point ROS discussed and pertinent positives are indicated in the HPI above.  All other systems are negative.  Review of Systems  Vital Signs: BP 129/84 (BP Location: Left Arm)   Pulse 98   Temp 98.3 F (36.8 C) (Oral)   Resp (!) 27   Ht 5\' 2"   (1.575 m)   Wt 83.8 kg   SpO2 91%   BMI 33.79 kg/m   Physical Exam Constitutional:      Appearance: She is ill-appearing. She is not toxic-appearing.  HENT:     Mouth/Throat:     Mouth: Mucous membranes are moist.     Pharynx: Oropharynx is clear.  Cardiovascular:     Rate and Rhythm: Normal rate and regular rhythm.     Heart sounds: Normal heart sounds.  Pulmonary:     Effort: Pulmonary effort is normal. No respiratory distress.     Breath sounds: Normal breath sounds.  Abdominal:     General: Abdomen is flat.     Palpations: Abdomen is soft.     Tenderness: There is no abdominal tenderness.  Skin:    General: Skin is warm and dry.  Neurological:     General: No focal deficit present.     Mental Status: She is alert and oriented to person, place, and time.  Psychiatric:        Mood and Affect: Mood normal.        Thought Content: Thought content normal.        Judgment: Judgment normal.    Imaging: CT HEAD WO CONTRAST  Result Date: 01/09/2021 CLINICAL DATA:  Delirium.  Confusion. EXAM: CT HEAD WITHOUT CONTRAST TECHNIQUE: Contiguous axial images were obtained from the base of the skull through the vertex without intravenous contrast. COMPARISON:  None. FINDINGS: Brain: No acute infarct, hemorrhage, or mass lesion is present. The ventricles are of normal size. No significant extraaxial fluid collection is present. The craniocervical junction is normal. Upper cervical spine is within normal limits. Marrow signal is unremarkable. Vascular: Atherosclerotic calcifications are present within the cavernous internal carotid arteries bilaterally. No hyperdense vessel is present. Skull: Calvarium is intact. No focal lytic or blastic lesions are present. No significant extracranial soft tissue lesion is present. Sinuses/Orbits: The paranasal sinuses and mastoid air cells are clear. The globes and orbits are within normal limits. IMPRESSION: 1. Normal CT appearance of the brain. 2.  Atherosclerosis. Electronically Signed   By: Marin Robertshristopher  Mattern M.D.   On: 01/09/2021 15:12   MR BRAIN WO CONTRAST  Result Date: 01/10/2021 CLINICAL DATA:  Delirium. EXAM: MRI HEAD WITHOUT CONTRAST TECHNIQUE: Multiplanar, multiecho pulse sequences of the brain and surrounding structures were obtained without intravenous contrast. COMPARISON:  None. FINDINGS: Brain: No acute infarct, hemorrhage, or mass lesion is present. No significant white matter lesions are present. The ventricles are of normal size. No significant extraaxial fluid collection is present. The internal auditory canals are within normal limits. The brainstem and cerebellum are within normal limits. Vascular: Flow is present in the major intracranial arteries. Skull and upper cervical spine: The craniocervical junction is normal. Upper cervical spine is within normal limits. Marrow signal  is unremarkable. Sinuses/Orbits: The paranasal sinuses and mastoid air cells are clear. The globes and orbits are within normal limits. IMPRESSION: Negative MRI of the brain. Electronically Signed   By: Marin Roberts M.D.   On: 01/10/2021 14:35   MR LIVER WO CONRTAST  Result Date: 01/10/2021 CLINICAL DATA:  Indeterminate hepatic lesions on recent noncontrast CT. Nausea and vomiting. Diarrhea. EXAM: MRI ABDOMEN WITHOUT CONTRAST TECHNIQUE: Multiplanar multisequence MR imaging was performed without the administration of intravenous contrast. COMPARISON:  Noncontrast CT on 01/08/2021 from [facility] FINDINGS: Lower chest: No acute findings. Hepatobiliary: Multiple T2 hyperintense lesions are seen throughout the right and left lobes, largest in the central right hepatic lobe measuring 6.9 x 6.0 cm. These lesions cannot be completely characterized on this unenhanced exam, but are suspicious for metastatic disease with abscess is considered less likely. Gallbladder is unremarkable. No evidence of biliary ductal dilatation. Pancreas: No mass or  inflammatory process visualized on this unenhanced exam. Spleen:  Within normal limits in size. Adrenals/Urinary tract: Unremarkable. No evidence of nephrolithiasis or hydronephrosis. Stomach/Bowel: Visualized portion unremarkable. Vascular/Lymphatic: No pathologically enlarged lymph nodes identified. No evidence of abdominal aortic aneurysm. Other:  None. Musculoskeletal:  No suspicious bone lesions identified. IMPRESSION: Multiple lesions throughout the liver, largest in the central right lobe measuring 6.9 cm. While these lesions cannot be completely characterized on this unenhanced exam, they are suspicious for metastatic disease with abscesses considered less likely. Consider abdomen MRI without and with contrast for further characterization. (Note that, unlike CT contrast, there is no shortage of gadolinium IV contrast for MRI). Electronically Signed   By: Danae Orleans M.D.   On: 01/10/2021 15:00   MR LIVER W WO CONTRAST  Result Date: 01/11/2021 CLINICAL DATA:  Indeterminate liver lesions. EXAM: MRI ABDOMEN WITHOUT AND WITH CONTRAST TECHNIQUE: Multiplanar multisequence MR imaging of the abdomen was performed both before and after the administration of intravenous contrast. CONTRAST:  69mL GADAVIST GADOBUTROL 1 MMOL/ML IV SOLN COMPARISON:  Noncontrast exams on 01/10/2021 and 01/08/2021 FINDINGS: Lower chest: Tiny right pleural effusion noted. Hepatobiliary: A complex cystic lesion is seen in the medial liver dome which shows numerous irregular enhancing internal septations and thin peripheral rim enhancement. This measures 6.8 x 6.3 cm on image 31/23. Multiple other smaller liver lesions are seen in both the right and left lobes which show thin peripheral rim enhancement. No solid liver masses are identified. These characteristics favor multiple hepatic abscesses over metastatic disease. Gallbladder is unremarkable. No evidence of biliary ductal dilatation. Pancreas:  No mass or inflammatory changes. Spleen:   Within normal limits in size and appearance. Adrenals/Urinary Tract: No masses identified. Tiny sub-cm cyst noted in upper pole of right kidney. No evidence of hydronephrosis. Stomach/Bowel: Visualized portion unremarkable. Vascular/Lymphatic: No pathologically enlarged lymph nodes identified. No acute vascular findings. Other: Minimal perihepatic ascites noted, as well as generalized retroperitoneal and body wall edema. Musculoskeletal:  No suspicious bone lesions identified. IMPRESSION: Dominant 7 cm complex cystic lesion in the medial liver dome, with multiple smaller liver lesions showing thin peripheral rim enhancement. These characteristics favor multiple hepatic abscesses over metastatic disease. Minimal perihepatic ascites, as well as generalized retroperitoneal and body wall edema. Tiny right pleural effusion. Electronically Signed   By: Danae Orleans M.D.   On: 01/11/2021 10:17    Labs:  CBC: Recent Labs    01/08/21 2107 01/09/21 0548 01/10/21 0252 01/11/21 0914  WBC 17.8* 18.3* 23.8* 21.7*  HGB 10.2* 11.6* 11.2* 10.6*  HCT 29.3* 33.2* 32.7* 31.8*  PLT 141* 153 197 217    COAGS: No results for input(s): INR, APTT in the last 8760 hours.  BMP: Recent Labs    01/09/21 1400 01/09/21 2128 01/10/21 0252 01/11/21 0914  NA 126* 129* 132* 136  K 2.9* 3.3* 3.0* 3.8  CL 88* 98 101 111  CO2 22 20* 21* 20*  GLUCOSE 84 174* 133* 98  BUN 41* 41* 38* 19  CALCIUM 7.5* 7.4* 7.5* 7.5*  CREATININE 2.68* 2.02* 1.72* 0.94  GFRNONAA 19* 26* 32* >60    LIVER FUNCTION TESTS: Recent Labs    01/09/21 0548 01/10/21 0252 01/11/21 0914  BILITOT 1.7* 1.0 0.4  AST 795* 279* 124*  ALT 897* 642* 356*  ALKPHOS 118 139* 124  PROT 5.8* 5.8* 4.9*  ALBUMIN 2.4* 2.3* 2.0*    TUMOR MARKERS: No results for input(s): AFPTM, CEA, CA199, CHROMGRNA in the last 8760 hours.  Assessment and Plan: Hepatic abscess Imaging reviewed, amenable to percutaneous drainage Labs reviewed. Risks and  benefits discussed with the patient including bleeding, infection, damage to adjacent structures, perforation/fistula connection, and sepsis.  All of the patient's questions were answered, patient is agreeable to proceed. Consent signed and in chart.   Thank you for this interesting consult.  I greatly enjoyed meeting Toni Demo and look forward to participating in their care.  A copy of this report was sent to the requesting provider on this date.  Electronically Signed: Brayton El, PA-C 01/11/2021, 11:46 AM   I spent a total of 20 minutes in face to face in clinical consultation, greater than 50% of which was counseling/coordinating care for hepatic abscess drain

## 2021-01-11 NOTE — Progress Notes (Signed)
MEDICATION-RELATED CONSULT NOTE   IR Procedure Consult - Anticoagulant/Antiplatelet PTA/Inpatient Med List Review by Pharmacist    Procedure: percutaneous drainage of hepatic abscess    Completed: 01/11/21  Post-Procedural bleeding risk per IR MD assessment:  standard  Antithrombotic medications on inpatient or PTA profile prior to procedure:     - pt currently does not have any active orders for anticoag. or antiplatelets   Plan:     - pharmacy will sign off  Dorna Leitz, PharmD, BCPS 01/11/2021 2:31 PM

## 2021-01-11 NOTE — Progress Notes (Signed)
Pt has arrived from Radiology/IR with right upper quadrant drain in place, draining tan purulent fluid to gravity bag. Sister of pt in room. Pt is aware and oriented to new room and drain placement.

## 2021-01-11 NOTE — Progress Notes (Signed)
  Echocardiogram 2D Echocardiogram with 3D and strain has been performed.  Leta Jungling M 01/11/2021, 10:28 AM

## 2021-01-11 NOTE — Procedures (Signed)
  Procedure: CT guided liver abscess drain  64f  aspirate for GS,C&S EBL:   minimal Complications:  none immediate  See full dictation in [employer].  Thora Lance MD Main # [phone] Pager  [phone]

## 2021-01-11 NOTE — Progress Notes (Addendum)
PROGRESS NOTE    Brenda Morse  UEA:[id number] DOB: 05/08/1951 DOA: 01/08/2021 PCP: Maggie Schwalbe, PA-C   No chief complaint on file.  Brief Narrative:  Brenda Morse is Brenda Morse 70 y.o. female with medical history significant of  significant of diverticular disease, GERD, Hashimoto's thyroiditis, thrombocytopenia, essential tremor, chronic interstitial cystitis, history of gastric ulcer and gastritis, irritable bowel syndrome, B12 deficiency, who was seen at [facility] with acute vasculopathy sepsis elevated for work-up.  Patient was seen by medical service.  Consultation.  She apparently has been having profuse diarrhea up to 40 times Brenda Morse day with nausea but no vomiting.  This been going on for over 2 weeks.  Denied any significant abdominal pain.  No urinary symptoms.  Patient had colonoscopy about 2 and half months ago by Dr. Lyndel Safe Nothing was seen at the time except for diverticular disease.  But she came in today with fever and chills.  COVID-19 was negative.  Patient was evaluated and found to have alert several centimeter mass in the liver with another small liver mass.  Also elevated LFTs concerning for abscess versus malignancy.  Patient had [name] white count of 23,000.  Temperature 1.3.  She also had urinalysis that is nondiagnostic.  Lactic acid was 1.3 magnesium 2.1.  Tylenol level was negative.  Her AST was 30-55 ALT 1481 total bilirubin 2.3.  She is also in acute kidney injury with creatinine 2.10 glucose 163 BUN 27.  Her hemoglobin was found to be 10.9.  Based on her findings patient is suspected to have liver abscess.  She was therefore transferred here for further evaluation and treatment.  She has underlying history of depression as well but appears stable here.  She reported no diarrhea since arrival here at [facility] long.  Also no nausea or vomiting at this point.  Patient is being admitted with expected GI follow-up..   Assessment & Plan:   Principal Problem:   Liver mass Active  Problems:   Allergic rhinitis   ETD (eustachian tube dysfunction)   Hyperlipidemia  Sepsis with concern for liver abscesses vs metastatic disease  Strep Intermedius Bacteremia  Sepsis based on hypothermia, leukocytosis.  Qsofa  1-2/3 with occasionally soft BP and AMS. Suspect infection maybe more likely etiology with leukocytosis, hypothermia, N/V/D prior to presentation in addition to bacteremia. CT concerning for hypoattenuating masses in liver and focal nodular wall thickening in sigmoid colon (see below) Follow MRI liver (without contrast given renal function) - with multiple lesions throughout liver, largest in central R lobe measuring 6.9 cm - incompletely char on unenhanced exam MRI liver with/without pending results Continue ceftriaxone, flagyl - will d/c vanc given intraabdominal infection, suspect less likely need MRSA coverage ( was on vanc/imipenem at OSH) Follow GI pathogen panel pending  Follow blood cultures NG.  [place] blood cultures 2/2 strep intermedius (will need to have them fax paperwork over again as I don't see in chart).  1/2 staph epi, suspect contaminant.   Follow echocardiogram Elevated procal, ESR, CRP.  Leukocytosis.  Procal downtrending today, still significantly elevated.  ID c/s, appreciate recs Consider GI c/s May need IR for aspiration, will follow results of MRI to better char lesions first - discussed with Dr. Vernard Gambles from IR, planning for CT guided aspiration/biopsy/drain pending results of MRI   Addendum - clarified with pt, dental work (implant) in November - had several check ups since then, but sounds like extent was flossing and evaluation, not much more  CT from OSH with new multiple  hypoattenuating masses in the liver, largest measuring up to 6.3 cm, concerning for metastataic disease or possibly intrahepatic abscesses given the patient's leukocytosis and elevated liver enzymes.  Focal nodular wall thickening in the sigmoid colon with mild  adjacent stranding which could represent Brenda Morse colonic malignancy or short segment diverticulitis.  Recommend multiphase contrast enhanced MRI when the patient is stable and can tolerate Brenda Morse breath hold to further evaluate these liver masses.  Nondilated gallbladder with nonspecific wall thickening and non visible radioopaque stones.  Cholecystitis vs hepatocellular disease.  Small hiatal hernia.  Likely postinfectious/postinflammatory scarring and atelectasis in lower lungs, R>L.  Elevated Liver Enzymes Likely related to above - gradually improving Follow acute hepatitis studies and liver MRI Trend (pending labs today)  Concern for tick borne illness Doxy Appreciate ID recs  Acute Kidney Injury  Hyponatremia Likely 2/2 sepsis, hypovolemia, improving with IVF, follow Follow urine studies - suggest na avid state - continue IVF Pending labs today  Nausea  Vomiting  Diarrhea  Positive C diff antigen, negative toxin, positive PCR With positive c diff antigen and PCR, will treat with oral vanc Likely 2/2 above Treating for severe c diff, appreciate ID recs (500 qid vanc + flagyl)  Acute Metabolic encephalopathy Appears essentially completely resolved today Follow head CT - normal CT appearance of brain Normal ammonia, TSH, folate.  Elevated B12. EEG Suspect this is 2/2 infection above.  Delirium precautions Follow MRI negative -- consider MRI with contrast, but given her significant improvement, will hold off at this time  Hypertension Holding atenolol and HCTZ for now  Hx Hashimoto's thyroiditis Normal TSH, not on meds  Hx Essential Tremor  Hx Chronic Intersitial Cystitis  Hx Gastric Ulcer  Gastritis  Holding singulair, zyrtec, afrin for now  DVT prophylaxis:lovenox Code Status: full  Family Communication: discussed with sister over phone 6/18 Disposition:   Status is: Inpatient  Remains inpatient appropriate because:Inpatient level of care appropriate due to severity of  illness  Dispo: The patient is from: Home              Anticipated d/c is to: Home              Patient currently is not medically stable to d/c.   Difficult to place patient No       Consultants:  ID  Procedures:  none  Antimicrobials:  Anti-infectives (From admission, onward)    Start     Dose/Rate Route Frequency Ordered Stop   01/10/21 0000  ceFEPIme (MAXIPIME) 2 g in sodium chloride 0.9 % 100 mL IVPB  Status:  Discontinued        2 g 200 mL/hr over 30 Minutes Intravenous Morse 24 hours 01/09/21 0037 01/09/21 1547   01/09/21 2200  ceFEPIme (MAXIPIME) 2 g in sodium chloride 0.9 % 100 mL IVPB        2 g 200 mL/hr over 30 Minutes Intravenous Morse 24 hours 01/09/21 1628     01/09/21 1800  vancomycin (VANCOCIN) capsule 500 mg        500 mg Oral 4 times daily 01/09/21 1631     01/09/21 1700  doxycycline (VIBRAMYCIN) 100 mg in sodium chloride 0.9 % 250 mL IVPB        100 mg 125 mL/hr over 120 Minutes Intravenous Morse 12 hours 01/09/21 1547     01/09/21 1700  metroNIDAZOLE (FLAGYL) IVPB 500 mg        500 mg 100 mL/hr over 60 Minutes Intravenous Morse 8 hours 01/09/21  1609     01/09/21 1545  doxycycline (VIBRAMYCIN) 100 mg in sodium chloride 0.9 % 250 mL IVPB  Status:  Discontinued        100 mg 125 mL/hr over 120 Minutes Intravenous 2 times daily 01/09/21 1528 01/09/21 1535   01/09/21 1015  vancomycin (VANCOCIN) capsule 125 mg  Status:  Discontinued        125 mg Oral 4 times daily 01/09/21 0928 01/09/21 1631   01/09/21 0800  metroNIDAZOLE (FLAGYL) IVPB 500 mg  Status:  Discontinued        500 mg 100 mL/hr over 60 Minutes Intravenous Morse 8 hours 01/09/21 0744 01/09/21 1547   01/09/21 0009  vancomycin variable dose per unstable renal function (pharmacist dosing)  Status:  Discontinued         Does not apply See admin instructions 01/09/21 0009 01/09/21 1147   01/08/21 2230  vancomycin (VANCOREADY) IVPB 1250 mg/250 mL        1,250 mg 166.7 mL/hr over 90 Minutes  Intravenous  Once 01/08/21 2121 01/09/21 1741   01/08/21 2200  ceFEPIme (MAXIPIME) 2 g in sodium chloride 0.9 % 100 mL IVPB        2 g 200 mL/hr over 30 Minutes Intravenous NOW 01/08/21 2117 01/09/21 1605       Subjective: Denies any new complaints today - feels better Thanks me for my update on her clinical status More alert  Objective: Vitals:   01/11/21 0556 01/11/21 0600 01/11/21 0607 01/11/21 0840  BP:   115/71   Pulse: 72 71 82   Resp: (!) 21 (!) 25 (!) 24   Temp:    98.3 F (36.8 C)  TempSrc:    Oral  SpO2: 96% 94% 95%   Weight:      Height:        Intake/Output Summary (Last 24 hours) at 01/11/2021 0941 Last data filed at 01/11/2021 5409 Gross per 24 hour  Intake 2633.41 ml  Output 600 ml  Net 2033.41 ml   Filed Weights   01/08/21 2123 01/10/21 0400 01/11/21 0142  Weight: 81.6 kg 82.8 kg 83.8 kg    Examination:  General: No acute distress. Cardiovascular: RRR Lungs: unlabored Abdomen: Soft, nontender, nondistended Neurological: Alert and oriented 3. Moves all extremities 4. Cranial nerves II through XII grossly intact.  Follows commands.  No lethargy today, appears to be essentially at baseline.  Skin: Warm and dry. No rashes or lesions. Extremities: No clubbing or cyanosis. No edema.   Data Reviewed: I have personally reviewed following labs and imaging studies  CBC: Recent Labs  Lab 01/08/21 2107 01/09/21 0548 01/10/21 0252 01/11/21 0914  WBC 17.8* 18.3* 23.8* 21.7*  NEUTROABS  --   --   --  PENDING  HGB 10.2* 11.6* 11.2* 10.6*  HCT 29.3* 33.2* 32.7* 31.8*  MCV 82.3 82.4 81.5 84.8  PLT 141* 153 197 811    Basic Metabolic Panel: Recent Labs  Lab 01/08/21 2107 01/09/21 0548 01/09/21 1400 01/09/21 2128 01/10/21 0252  NA  --  126* 126* 129* 132*  K  --  3.2* 2.9* 3.3* 3.0*  CL  --  91* 88* 98 101  CO2  --  19* 22 20* 21*  GLUCOSE  --  96 84 174* 133*  BUN  --  42* 41* 41* 38*  CREATININE 2.56* 2.87* 2.68* 2.02* 1.72*  CALCIUM  --   7.5* 7.5* 7.4* 7.5*  MG  --   --   --   --  2.3  PHOS  --   --   --  3.6  --     GFR: Estimated Creatinine Clearance: 30.6 mL/min (Tamotsu Wieder[place]) (by C-G formula based on SCr of 1.72 mg/dL (H)).  Liver Function Tests: Recent Labs  Lab 01/09/21 0548 01/10/21 0252  AST 795* 279*  ALT 897* 642*  ALKPHOS 118 139*  BILITOT 1.7* 1.0  PROT 5.8* 5.8*  ALBUMIN 2.4* 2.3*    CBG: Recent Labs  Lab 01/09/21 0231  GLUCAP 93     Recent Results (from the past 240 hour(s))  MRSA PCR Screening     Status: None   Collection Time: 01/09/21  4:08 PM  Result Value Ref Range Status   MRSA by PCR NEGATIVE NEGATIVE Final    Comment:        The GeneXpert MRSA Assay (FDA approved for NASAL specimens only), is one component of Briel Gallicchio comprehensive MRSA colonization surveillance program. It is not intended to diagnose MRSA infection nor to guide or monitor treatment for MRSA infections. Performed at [facility], [place] [address] [place], [place] [address]          Radiology Studies: CT HEAD WO CONTRAST  Result Date: 01/09/2021 CLINICAL DATA:  Delirium.  Confusion. EXAM: CT HEAD WITHOUT CONTRAST TECHNIQUE: Contiguous axial images were obtained from the base of the skull through the vertex without intravenous contrast. COMPARISON:  None. FINDINGS: Brain: No acute infarct, hemorrhage, or mass lesion is present. The ventricles are of normal size. No significant extraaxial fluid collection is present. The craniocervical junction is normal. Upper cervical spine is within normal limits. Marrow signal is unremarkable. Vascular: Atherosclerotic calcifications are present within the cavernous internal carotid arteries bilaterally. No hyperdense vessel is present. Skull: Calvarium is intact. No focal lytic or blastic lesions are present. No significant extracranial soft tissue lesion is present. Sinuses/Orbits: The paranasal sinuses and mastoid air cells are clear. The globes and orbits are within  normal limits. IMPRESSION: 1. Normal CT appearance of the brain. 2. Atherosclerosis. Electronically Signed   By: San Morelle M.D.   On: 01/09/2021 15:12   MR BRAIN WO CONTRAST  Result Date: 01/10/2021 CLINICAL DATA:  Delirium. EXAM: MRI HEAD WITHOUT CONTRAST TECHNIQUE: Multiplanar, multiecho pulse sequences of the brain and surrounding structures were obtained without intravenous contrast. COMPARISON:  None. FINDINGS: Brain: No acute infarct, hemorrhage, or mass lesion is present. No significant white matter lesions are present. The ventricles are of normal size. No significant extraaxial fluid collection is present. The internal auditory canals are within normal limits. The brainstem and cerebellum are within normal limits. Vascular: Flow is present in the major intracranial arteries. Skull and upper cervical spine: The craniocervical junction is normal. Upper cervical spine is within normal limits. Marrow signal is unremarkable. Sinuses/Orbits: The paranasal sinuses and mastoid air cells are clear. The globes and orbits are within normal limits. IMPRESSION: Negative MRI of the brain. Electronically Signed   By: San Morelle M.D.   On: 01/10/2021 14:35   MR LIVER WO CONRTAST  Result Date: 01/10/2021 CLINICAL DATA:  Indeterminate hepatic lesions on recent noncontrast CT. Nausea and vomiting. Diarrhea. EXAM: MRI ABDOMEN WITHOUT CONTRAST TECHNIQUE: Multiplanar multisequence MR imaging was performed without the administration of intravenous contrast. COMPARISON:  Noncontrast CT on 01/08/2021 from [place]: Lower chest: No acute findings. Hepatobiliary: Multiple T2 hyperintense lesions are seen throughout the right and left lobes, largest in the central right hepatic lobe measuring 6.9 x 6.0 cm. These lesions cannot be completely  characterized on this unenhanced exam, but are suspicious for metastatic disease with abscess is considered less likely. Gallbladder is unremarkable.  No evidence of biliary ductal dilatation. Pancreas: No mass or inflammatory process visualized on this unenhanced exam. Spleen:  Within normal limits in size. Adrenals/Urinary tract: Unremarkable. No evidence of nephrolithiasis or hydronephrosis. Stomach/Bowel: Visualized portion unremarkable. Vascular/Lymphatic: No pathologically enlarged lymph nodes identified. No evidence of abdominal aortic aneurysm. Other:  None. Musculoskeletal:  No suspicious bone lesions identified. IMPRESSION: Multiple lesions throughout the liver, largest in the central right lobe measuring 6.9 cm. While these lesions cannot be completely characterized on this unenhanced exam, they are suspicious for metastatic disease with abscesses considered less likely. Consider abdomen MRI without and with contrast for further characterization. (Note that, unlike CT contrast, there is no shortage of gadolinium IV contrast for MRI). Electronically Signed   By: Marlaine Hind M.D.   On: 01/10/2021 15:00        Scheduled Meds:  Chlorhexidine Gluconate Cloth  6 each Topical Daily   melatonin  3 mg Oral QHS   [START ON 01/13/2021] thiamine injection  100 mg Intravenous Daily   vancomycin  500 mg Oral QID   Continuous Infusions:  ceFEPime (MAXIPIME) IV Stopped (01/10/21 2143)   dextrose 5 % and 0.9 % NaCl with KCl 20 mEq/L 100 mL/hr at 01/11/21 0608   doxycycline (VIBRAMYCIN) IV Stopped (01/11/21 1121)   metronidazole 500 mg (01/11/21 0930)   thiamine injection Stopped (01/11/21 0212)     LOS: 3 days    Time spent: over 30 min    Fayrene Helper, MD [facility]   To contact the attending provider between 7A-7P or the covering provider during after hours 7P-7A, please log into the web site [email or web address] and access using universal  password for that web site. If you do not have the password, please call the hospital operator.  01/11/2021, 9:41 AM

## 2021-01-12 ENCOUNTER — Inpatient Hospital Stay (HOSPITAL_COMMUNITY)
Admission: AD | Admit: 2021-01-12 | Discharge: 2021-01-12 | Disposition: A | Discharge status: Home / Self Care | Payer: 59 | Source: Other Acute Inpatient Hospital | Attending: Family Medicine | Admitting: Family Medicine

## 2021-01-12 DIAGNOSIS — R4182 Altered mental status, unspecified: Secondary | ICD-10-CM

## 2021-01-12 LAB — CBC WITH DIFFERENTIAL/PLATELET
Abs Immature Granulocytes: 1.5 10*3/uL — ABNORMAL HIGH (ref 0.00–0.07)
Basophils Absolute: 0 10*3/uL (ref 0.0–0.1)
Basophils Relative: 0 %
Eosinophils Absolute: 0.3 10*3/uL (ref 0.0–0.5)
Eosinophils Relative: 1 %
HCT: 32.4 % — ABNORMAL LOW (ref 36.0–46.0)
Hemoglobin: 10.6 g/dL — ABNORMAL LOW (ref 12.0–15.0)
Immature Granulocytes: 7 %
Lymphocytes Relative: 7 %
Lymphs Abs: 1.5 10*3/uL (ref 0.7–4.0)
MCH: 28.2 pg (ref 26.0–34.0)
MCHC: 32.7 g/dL (ref 30.0–36.0)
MCV: 86.2 fL (ref 80.0–100.0)
Monocytes Absolute: 0.8 10*3/uL (ref 0.1–1.0)
Monocytes Relative: 4 %
Neutro Abs: 17.4 10*3/uL — ABNORMAL HIGH (ref 1.7–7.7)
Neutrophils Relative %: 81 %
Platelets: 221 10*3/uL (ref 150–400)
RBC: 3.76 MIL/uL — ABNORMAL LOW (ref 3.87–5.11)
RDW: 14.6 % (ref 11.5–15.5)
WBC: 21.5 10*3/uL — ABNORMAL HIGH (ref 4.0–10.5)
nRBC: 0 % (ref 0.0–0.2)

## 2021-01-12 LAB — COMPREHENSIVE METABOLIC PANEL
ALT: 273 U/L — ABNORMAL HIGH (ref 0–44)
AST: 110 U/L — ABNORMAL HIGH (ref 15–41)
Albumin: 1.9 g/dL — ABNORMAL LOW (ref 3.5–5.0)
Alkaline Phosphatase: 122 U/L (ref 38–126)
Anion gap: 7 (ref 5–15)
BUN: 14 mg/dL (ref 8–23)
CO2: 20 mmol/L — ABNORMAL LOW (ref 22–32)
Calcium: 7.6 mg/dL — ABNORMAL LOW (ref 8.9–10.3)
Chloride: 111 mmol/L (ref 98–111)
Creatinine, Ser: 0.98 mg/dL (ref 0.44–1.00)
GFR, Estimated: >60 mL/min (ref >60)
Glucose, Bld: 95 mg/dL (ref 70–99)
Potassium: 3.9 mmol/L (ref 3.5–5.1)
Sodium: 138 mmol/L (ref 135–145)
Total Bilirubin: 0.5 mg/dL (ref 0.3–1.2)
Total Protein: 4.9 g/dL — ABNORMAL LOW (ref 6.5–8.1)

## 2021-01-12 LAB — MAGNESIUM: Magnesium: 1.9 mg/dL (ref 1.7–2.4)

## 2021-01-12 LAB — PROTIME-INR
INR: 1.3 — ABNORMAL HIGH (ref 0.8–1.2)
Prothrombin Time: 16.5 seconds — ABNORMAL HIGH (ref 11.4–15.2)

## 2021-01-12 LAB — PHOSPHORUS: Phosphorus: 2.2 mg/dL — ABNORMAL LOW (ref 2.5–4.6)

## 2021-01-12 MED ORDER — METRONIDAZOLE 500 MG/100ML IV SOLN
500.0000 mg | Freq: Once | INTRAVENOUS | Status: AC
  Administered 2021-01-12: 500 mg via INTRAVENOUS
  Filled 2021-01-12: qty 100

## 2021-01-12 MED ORDER — AMOXICILLIN 500 MG PO CAPS
500.0000 mg | ORAL_CAPSULE | Freq: Once | ORAL | Status: AC
  Administered 2021-01-12: 500 mg via ORAL
  Filled 2021-01-12: qty 1

## 2021-01-12 MED ORDER — DIPHENHYDRAMINE HCL 50 MG/ML IJ SOLN: 25.0000 mg | Freq: Once | INTRAMUSCULAR | Status: DC | PRN

## 2021-01-12 MED ORDER — EPINEPHRINE 0.3 MG/0.3ML IJ SOAJ
0.3000 mg | Freq: Once | INTRAMUSCULAR | Status: DC | PRN
  Filled 2021-01-12: qty 0.6

## 2021-01-12 MED ORDER — SODIUM CHLORIDE 0.9 % IV SOLN: INTRAVENOUS | Status: DC

## 2021-01-12 NOTE — Progress Notes (Signed)
PROGRESS NOTE    Brenda Morse  SHF:[id number] DOB: 1950/10/15 DOA: 01/08/2021 PCP: Brenda Schwalbe, PA-C   No chief complaint on file.  Brief Narrative:  Brenda Morse is a 70 y.o. female with medical history significant of  significant of diverticular disease, GERD, Hashimoto's thyroiditis, thrombocytopenia, essential tremor, chronic interstitial cystitis, history of gastric ulcer and gastritis, irritable bowel syndrome, B12 deficiency, who was seen at [facility] with acute vasculopathy sepsis elevated for work-up.  Patient was seen by medical service.  Consultation.  She apparently has been having profuse diarrhea up to 40 times a day with nausea but no vomiting.  This been going on for over 2 weeks.  Denied any significant abdominal pain.  No urinary symptoms.  Patient had colonoscopy about 2 and half months ago by Dr. Lyndel Morse Nothing was seen at the time except for diverticular disease.  But she came in today with fever and chills.  COVID-19 was negative.  Patient was evaluated and found to have alert several centimeter mass in the liver with another small liver mass.  Also elevated LFTs concerning for abscess versus malignancy.  Patient had a white count of 23,000.  Temperature 1.3.  She also had urinalysis that is nondiagnostic.  Lactic acid was 1.3 magnesium 2.1.  Tylenol level was negative.  Her AST was 30-55 ALT 1481 total bilirubin 2.3.  She is also in acute kidney injury with creatinine 2.10 glucose 163 BUN 27.  Her hemoglobin was found to be 10.9.  Based on her findings patient is suspected to have liver abscess.  She was therefore transferred here for further evaluation and treatment.  She has underlying history of depression as well but appears stable here.  She reported no diarrhea since arrival here at [facility].  Also no nausea or vomiting at this point.  Patient is being admitted with expected GI follow-up..   Assessment & Plan:   Principal Problem:   Liver mass Active  Problems:   Allergic rhinitis   ETD (eustachian tube dysfunction)   Hyperlipidemia  Multiple Hepatic Abscesses  Strep Intermedius Bacteremia  Sepsis  Sepsis physiology resolved at this time CT concerning for hypoattenuating masses in liver and focal nodular wall thickening in sigmoid colon (see below) Follow MRI liver (without contrast given renal function) - with multiple lesions throughout liver, largest in central R lobe measuring 6.9 cm - incompletely char on unenhanced exam MRI liver with/without contrast 6/19 with dominant 7 cm complex cystic lesion in the medial liver dome with multiple smaller liver lesions showing thin peripheral rim enhancement (favors multiple hepatic abscesses over metastatic disease).  Minimal perihepatic ascites as well as generalized retroperitoneal and body wall edema.   Continue ceftriaxone, flagyl.  Getting amoxicillin challenge per ID. Follow GI pathogen panel negative  Follow blood cultures pending [place] blood cultures 2/2 strep intermedius (will need to have them fax paperwork over again as I don't see in chart).  1/2 staph epi, suspect contaminant.   Follow echocardiogram -> no evidence valvular vegetation Plan for TEE 6/21 Elevated procal, ESR, CRP.  Leukocytosis.  Procal downtrending today, still significantly elevated.  ID c/s, appreciate recs Consider GI c/s S/p CT guided liver abscess drain on 6/19  Follow aspiration aerobic/anaerobic culture from 6/19  Addendum - clarified with pt, dental work (implant) in November - had several check ups since then, but sounds like extent was flossing and evaluation, not much more  CT from OSH with new multiple hypoattenuating masses in the liver, largest measuring up  to 6.3 cm, concerning for metastataic disease or possibly intrahepatic abscesses given the patient's leukocytosis and elevated liver enzymes.  Focal nodular wall thickening in the sigmoid colon with mild adjacent stranding which could represent  Brenda Morse colonic malignancy or short segment diverticulitis.  Recommend multiphase contrast enhanced MRI when the patient is stable and can tolerate Brenda Morse breath hold to further evaluate these liver masses.  Nondilated gallbladder with nonspecific wall thickening and non visible radioopaque stones.  Cholecystitis vs hepatocellular disease.  Small hiatal hernia.  Likely postinfectious/postinflammatory scarring and atelectasis in lower lungs, R>L.  Elevated Liver Enzymes Likely related to above - gradually improving Follow acute hepatitis studies (negative) and liver MRI (as above)  Concern for tick borne illness Doxy Appreciate ID recs  Acute Kidney Injury  Hyponatremia Improved after resuscitation/abx  Nausea  Vomiting  Diarrhea  Positive C diff antigen, negative toxin, positive PCR With positive c diff antigen and PCR, will treat with oral vanc Likely 2/2 above Treating for severe c diff, appreciate ID recs (500 qid vanc + flagyl) Given focal nodular wall thickening in CT from OSH, consider colonoscopy Brenda Morse  Acute Metabolic encephalopathy Appears essentially completely resolved today Follow head CT - normal CT appearance of brain Normal ammonia, TSH, folate.  Elevated B12. EEG -> will cancel this given improvement in mental status Suspect this is 2/2 infection above.  Delirium precautions Follow MRI negative -- consider MRI with contrast, but given her significant improvement, will hold off at this time  Hypertension Holding atenolol and HCTZ for now  Hx Hashimoto's thyroiditis Normal TSH, not on meds  Hx Essential Tremor  Hx Chronic Intersitial Cystitis  Hx Gastric Ulcer  Gastritis  Holding singulair, zyrtec, afrin for now  DVT prophylaxis:lovenox Code Status: full  Family Communication: discussed with sister at bedside Disposition:   Status is: Inpatient  Remains inpatient appropriate because:Inpatient level of care appropriate due to severity of illness  Dispo: The  patient is from: Home              Anticipated d/c is to: Home              Patient currently is not medically stable to d/c.   Difficult to place patient No       Consultants:  ID  Procedures:  Echo IMPRESSIONS     1. Left ventricular ejection fraction, by estimation, is 60 to 65%. Left  ventricular ejection fraction by 3D volume is 62 %. The left ventricle has  normal function. The left ventricle has no regional wall motion  abnormalities. Left ventricular diastolic   parameters are consistent with Grade I diastolic dysfunction (impaired  relaxation).   2. Right ventricular systolic function is normal. The right ventricular  size is normal.   3. The mitral valve is abnormal. trivial to mild mitral valve  regurgitation.   4. The aortic valve is tricuspid. Aortic valve regurgitation is not  visualized.   5. The inferior vena cava is normal in size with <50% respiratory  variability, suggesting right atrial pressure of 8 mmHg.   Comparison(s): No prior Echocardiogram.   Conclusion(s)/Recommendation(s): No evidence of valvular vegetations on  this transthoracic echocardiogram. Would recommend Ajeenah Heiny transesophageal  echocardiogram to exclude infective endocarditis if clinically indicated.   6/19 CT guided liver abscess drain          Antimicrobials:  Anti-infectives (From admission, onward)    Start     Dose/Rate Route Frequency Ordered Stop   01/12/21 1115  amoxicillin (AMOXIL) capsule  500 mg        500 mg Oral  Once 01/12/21 1016 01/12/21 1118   01/11/21 1100  cefTRIAXone (ROCEPHIN) 2 g in sodium chloride 0.9 % 100 mL IVPB        2 g 200 mL/hr over 30 Minutes Intravenous Daily 01/11/21 0951     01/10/21 0000  ceFEPIme (MAXIPIME) 2 g in sodium chloride 0.9 % 100 mL IVPB  Status:  Discontinued        2 g 200 mL/hr over 30 Minutes Intravenous Every 24 hours 01/09/21 0037 01/09/21 1547   01/09/21 2200  ceFEPIme (MAXIPIME) 2 g in sodium chloride 0.9 % 100 mL IVPB  Status:   Discontinued        2 g 200 mL/hr over 30 Minutes Intravenous Every 24 hours 01/09/21 1628 01/11/21 0951   01/09/21 1800  vancomycin (VANCOCIN) capsule 500 mg        500 mg Oral 4 times daily 01/09/21 1631     01/09/21 1700  doxycycline (VIBRAMYCIN) 100 mg in sodium chloride 0.9 % 250 mL IVPB        100 mg 125 mL/hr over 120 Minutes Intravenous Every 12 hours 01/09/21 1547     01/09/21 1700  metroNIDAZOLE (FLAGYL) IVPB 500 mg        500 mg 100 mL/hr over 60 Minutes Intravenous Every 8 hours 01/09/21 1609     01/09/21 1545  doxycycline (VIBRAMYCIN) 100 mg in sodium chloride 0.9 % 250 mL IVPB  Status:  Discontinued        100 mg 125 mL/hr over 120 Minutes Intravenous 2 times daily 01/09/21 1528 01/09/21 1535   01/09/21 1015  vancomycin (VANCOCIN) capsule 125 mg  Status:  Discontinued        125 mg Oral 4 times daily 01/09/21 0928 01/09/21 1631   01/09/21 0800  metroNIDAZOLE (FLAGYL) IVPB 500 mg  Status:  Discontinued        500 mg 100 mL/hr over 60 Minutes Intravenous Every 8 hours 01/09/21 0744 01/09/21 1547   01/09/21 0009  vancomycin variable dose per unstable renal function (pharmacist dosing)  Status:  Discontinued         Does not apply See admin instructions 01/09/21 0009 01/09/21 1147   01/08/21 2230  vancomycin (VANCOREADY) IVPB 1250 mg/250 mL        1,250 mg 166.7 mL/hr over 90 Minutes Intravenous  Once 01/08/21 2121 01/09/21 1741   01/08/21 2200  ceFEPIme (MAXIPIME) 2 g in sodium chloride 0.9 % 100 mL IVPB        2 g 200 mL/hr over 30 Minutes Intravenous NOW 01/08/21 2117 01/09/21 1605       Subjective: No new complaints today  Objective: Vitals:   01/12/21 1100 01/12/21 1143 01/12/21 1241 01/12/21 1438  BP: 131/75 133/76 (!) 142/75 (!) 143/78  Pulse: 84   90  Resp: 18     Temp: 98.1 F (36.7 C)   98.4 F (36.9 C)  TempSrc:    Oral  SpO2: 95% 97% 96% 96%  Weight:      Height:        Intake/Output Summary (Last 24 hours) at 01/12/2021 1610 Last data filed at  01/12/2021 1000 Gross per 24 hour  Intake 1252.72 ml  Output 100 ml  Net 1152.72 ml   Filed Weights   01/08/21 2123 01/10/21 0400 01/11/21 0142  Weight: 81.6 kg 82.8 kg 83.8 kg    Examination:  General: No acute distress. Cardiovascular: Heart  sounds show a regular rate, and rhythm. Lungs: Clear to auscultation bilaterally Abdomen: Soft, nontender, nondistended  Neurological: Alert and oriented 3. Moves all extremities 4 . Cranial nerves II through XII grossly intact. Skin: Warm and dry. No rashes or lesions. Extremities: No clubbing or cyanosis. No edema.   Data Reviewed: I have personally reviewed following labs and imaging studies  CBC: Recent Labs  Lab 01/08/21 2107 01/09/21 0548 01/10/21 0252 01/11/21 0914 01/12/21 0552  WBC 17.8* 18.3* 23.8* 21.7* 21.5*  NEUTROABS  --   --   --  18.4* 17.4*  HGB 10.2* 11.6* 11.2* 10.6* 10.6*  HCT 29.3* 33.2* 32.7* 31.8* 32.4*  MCV 82.3 82.4 81.5 84.8 86.2  PLT 141* 153 197 217 564    Basic Metabolic Panel: Recent Labs  Lab 01/09/21 1400 01/09/21 2128 01/10/21 0252 01/11/21 0914 01/12/21 0552  NA 126* 129* 132* 136 138  K 2.9* 3.3* 3.0* 3.8 3.9  CL 88* 98 101 111 111  CO2 22 20* 21* 20* 20*  GLUCOSE 84 174* 133* 98 95  BUN 41* 41* 38* 19 14  CREATININE 2.68* 2.02* 1.72* 0.94 0.98  CALCIUM 7.5* 7.4* 7.5* 7.5* 7.6*  MG  --   --  2.3  --  1.9  PHOS  --  3.6  --   --  2.2*    GFR: Estimated Creatinine Clearance: 53.6 mL/min (by C-G formula based on SCr of 0.98 mg/dL).  Liver Function Tests: Recent Labs  Lab 01/09/21 0548 01/10/21 0252 01/11/21 0914 01/12/21 0552  AST 795* 279* 124* 110*  ALT 897* 642* 356* 273*  ALKPHOS 118 139* 124 122  BILITOT 1.7* 1.0 0.4 0.5  PROT 5.8* 5.8* 4.9* 4.9*  ALBUMIN 2.4* 2.3* 2.0* 1.9*    CBG: Recent Labs  Lab 01/09/21 0231  GLUCAP 93     Recent Results (from the past 240 hour(s))  MRSA PCR Screening     Status: None   Collection Time: 01/09/21  4:08 PM  Result  Value Ref Range Status   MRSA by PCR NEGATIVE NEGATIVE Final    Comment:        The GeneXpert MRSA Assay (FDA approved for NASAL specimens only), is one component of a comprehensive MRSA colonization surveillance program. It is not intended to diagnose MRSA infection nor to guide or monitor treatment for MRSA infections. Performed at [facility], [place] [address] [place], [place] [address]   Gastrointestinal Panel by PCR , Stool     Status: None   Collection Time: 01/11/21  3:25 AM   Specimen: STOOL  Result Value Ref Range Status   Campylobacter species NOT DETECTED NOT DETECTED Final   Plesimonas shigelloides NOT DETECTED NOT DETECTED Final   Salmonella species NOT DETECTED NOT DETECTED Final   Yersinia enterocolitica NOT DETECTED NOT DETECTED Final   Vibrio species NOT DETECTED NOT DETECTED Final   Vibrio cholerae NOT DETECTED NOT DETECTED Final   Enteroaggregative E coli (EAEC) NOT DETECTED NOT DETECTED Final   Enteropathogenic E coli (EPEC) NOT DETECTED NOT DETECTED Final   Enterotoxigenic E coli (ETEC) NOT DETECTED NOT DETECTED Final   Shiga like toxin producing E coli (STEC) NOT DETECTED NOT DETECTED Final   Shigella/Enteroinvasive E coli (EIEC) NOT DETECTED NOT DETECTED Final   Cryptosporidium NOT DETECTED NOT DETECTED Final   Cyclospora cayetanensis NOT DETECTED NOT DETECTED Final   Entamoeba histolytica NOT DETECTED NOT DETECTED Final   Giardia lamblia NOT DETECTED NOT DETECTED Final   Adenovirus F40/41 NOT  DETECTED NOT DETECTED Final   Astrovirus NOT DETECTED NOT DETECTED Final   Norovirus GI/GII NOT DETECTED NOT DETECTED Final   Rotavirus A NOT DETECTED NOT DETECTED Final   Sapovirus (I, II, IV, and V) NOT DETECTED NOT DETECTED Final    Comment: Performed at [facility], [place], [place] [address]  Aerobic/Anaerobic Culture w Gram Stain (surgical/deep wound)     Status: None (Preliminary result)   Collection Time:  01/11/21  2:14 PM   Specimen: Abscess  Result Value Ref Range Status   Specimen Description   Final    ABSCESS Performed at [facility] [address], [place], [place] [address]    Special Requests CT HEPATIC DRAIN  Final   Gram Stain PENDING  Incomplete   Culture   Final    NO GROWTH < 24 HOURS Performed at [facility], [place] [address], [place],  [address]    Report Status PENDING  Incomplete         Radiology Studies: MR LIVER W WO CONTRAST  Result Date: 01/11/2021 CLINICAL DATA:  Indeterminate liver lesions. EXAM: MRI ABDOMEN WITHOUT AND WITH CONTRAST TECHNIQUE: Multiplanar multisequence MR imaging of the abdomen was performed both before and after the administration of intravenous contrast. CONTRAST:  28m GADAVIST GADOBUTROL 1 MMOL/ML IV SOLN COMPARISON:  Noncontrast exams on 01/10/2021 and 01/08/2021 FINDINGS: Lower chest: Tiny right pleural effusion noted. Hepatobiliary: A complex cystic lesion is seen in the medial liver dome which shows numerous irregular enhancing internal septations and thin peripheral rim enhancement. This measures 6.8 x 6.3 cm on image 31/23. Multiple other smaller liver lesions are seen in both the right and left lobes which show thin peripheral rim enhancement. No solid liver masses are identified. These characteristics favor multiple hepatic abscesses over metastatic disease. Gallbladder is unremarkable. No evidence of biliary ductal dilatation. Pancreas:  No mass or inflammatory changes. Spleen:  Within normal limits in size and appearance. Adrenals/Urinary Tract: No masses identified. Tiny sub-cm cyst noted in upper pole of right kidney. No evidence of hydronephrosis. Stomach/Bowel: Visualized portion unremarkable. Vascular/Lymphatic: No pathologically enlarged lymph nodes identified. No acute vascular findings. Other: Minimal perihepatic ascites noted, as well as generalized retroperitoneal and body wall edema.  Musculoskeletal:  No suspicious bone lesions identified. IMPRESSION: Dominant 7 cm complex cystic lesion in the medial liver dome, with multiple smaller liver lesions showing thin peripheral rim enhancement. These characteristics favor multiple hepatic abscesses over metastatic disease. Minimal perihepatic ascites, as well as generalized retroperitoneal and body wall edema. Tiny right pleural effusion. Electronically Signed   By: JMarlaine HindM.D.   On: 01/11/2021 10:17   ECHOCARDIOGRAM COMPLETE  Result Date: 01/11/2021    ECHOCARDIOGRAM REPORT   Patient Name:   AFOYE HAGGARTDate of Exam: 01/11/2021 Medical Rec #:  [id number]    Height:       62.0 in Accession #:    [id number]   Weight:       184.7 lb Date of Birth:  [birth year]    BSA:          1.848 m Patient Age:    89years      BP:           124/64 mmHg Patient Gender: F             HR:           83 bpm. Exam Location:  Inpatient Procedure: 2D Echo, 3D Echo,  Cardiac Doppler, Color Doppler and Strain Analysis Indications:    Bacteremia R78.81  History:        Patient has no prior history of Echocardiogram examinations.                 GERD, Hashimoto's thyroiditis, Thrombocytopenia, Acute                 vasculopathy sepsis, Strep Intermedius Bacteremia.  Sonographer:    Darlina Sicilian RDCS Referring Phys: [id number] [place]  1. Left ventricular ejection fraction, by estimation, is 60 to 65%. Left ventricular ejection fraction by 3D volume is 62 %. The left ventricle has normal function. The left ventricle has no regional wall motion abnormalities. Left ventricular diastolic  parameters are consistent with Grade I diastolic dysfunction (impaired relaxation).  2. Right ventricular systolic function is normal. The right ventricular size is normal.  3. The mitral valve is abnormal. trivial to mild mitral valve regurgitation.  4. The aortic valve is tricuspid. Aortic valve regurgitation is not visualized.  5. The inferior vena cava is normal  in size with <50% respiratory variability, suggesting right atrial pressure of 8 mmHg. Comparison(s): No prior Echocardiogram. Conclusion(s)/Recommendation(s): No evidence of valvular vegetations on this transthoracic echocardiogram. Would recommend Cristobal Advani transesophageal echocardiogram to exclude infective endocarditis if clinically indicated. FINDINGS  Left Ventricle: Left ventricular ejection fraction, by estimation, is 60 to 65%. Left ventricular ejection fraction by 3D volume is 62 %. The left ventricle has normal function. The left ventricle has no regional wall motion abnormalities. The left ventricular internal cavity size was normal in size. There is no left ventricular hypertrophy. Left ventricular diastolic parameters are consistent with Grade I diastolic dysfunction (impaired relaxation). Indeterminate filling pressures. Right Ventricle: The right ventricular size is normal. No increase in right ventricular wall thickness. Right ventricular systolic function is normal. Left Atrium: Left atrial size was normal in size. Right Atrium: Right atrial size was normal in size. Pericardium: There is no evidence of pericardial effusion. Mitral Valve: The mitral valve is abnormal. There is mild thickening of the mitral valve leaflet(s). Trivial to mild mitral valve regurgitation, with posteriorly-directed jet. Tricuspid Valve: The tricuspid valve is grossly normal. Tricuspid valve regurgitation is trivial. Aortic Valve: The aortic valve is tricuspid. Aortic valve regurgitation is not visualized. Pulmonic Valve: The pulmonic valve was grossly normal. Pulmonic valve regurgitation is trivial. Aorta: The aortic root and ascending aorta are structurally normal, with no evidence of dilitation. Venous: The inferior vena cava is normal in size with less than 50% respiratory variability, suggesting right atrial pressure of 8 mmHg. IAS/Shunts: The interatrial septum was not well visualized.  LEFT VENTRICLE PLAX 2D LVIDd:          4.90 cm         Diastology LVIDs:         3.30 cm         LV e' medial:    6.19 cm/s LV PW:         0.80 cm         LV E/e' medial:  11.4 LV IVS:        0.90 cm         LV e' lateral:   9.13 cm/s LVOT diam:     2.00 cm         LV E/e' lateral: 7.7 LV SV:         60 LV SV Index:   33 LVOT Area:     3.14  cm        3D Volume EF                                LV 3D EF:    Left                                             ventricular                                             ejection                                             fraction by                                             3D volume                                             is 62 %.                                 3D Volume EF:                                3D EF:        62 % RIGHT VENTRICLE RV S prime:     12.00 cm/s TAPSE (M-mode): 1.7 cm LEFT ATRIUM             Index       RIGHT ATRIUM          Index LA diam:        3.30 cm 1.79 cm/m  RA Area:     8.17 cm LA Vol (A2C):   26.6 ml 14.39 ml/m RA Volume:   13.90 ml 7.52 ml/m LA Vol (A4C):   27.4 ml 14.82 ml/m LA Biplane Vol: 28.0 ml 15.15 ml/m  AORTIC VALVE LVOT Vmax:   88.60 cm/s LVOT Vmean:  65.100 cm/s LVOT VTI:    0.192 m  AORTA Ao Root diam: 3.00 cm Ao Asc diam:  2.90 cm MITRAL VALVE MV Area (PHT): 5.27 cm    SHUNTS MV Decel Time: 144 msec    Systemic VTI:  0.19 m MV E velocity: 70.70 cm/s  Systemic Diam: 2.00 cm MV A velocity: 86.00 cm/s MV E/A ratio:  0.82 Lyman Bishop MD Electronically signed by Lyman Bishop MD Signature Date/Time: 01/11/2021/1:15:30 PM    Final         Scheduled Meds:  Chlorhexidine Gluconate Cloth  6 each Topical Daily   melatonin  3 mg Oral QHS   sodium chloride flush  5 mL Intracatheter Q8H   [START ON 01/13/2021] thiamine injection  100 mg  Intravenous Daily   vancomycin  500 mg Oral QID   Continuous Infusions:  cefTRIAXone (ROCEPHIN)  IV 2 g (01/12/21 1146)   dextrose 5 % and 0.9 % NaCl with KCl 20 mEq/L 100 mL/hr at 01/11/21 2140   doxycycline  (VIBRAMYCIN) IV 100 mg (01/12/21 0508)   metronidazole 500 mg (01/12/21 0832)     LOS: 4 days    Time spent: over 30 min    Fayrene Helper, MD [facility]   To contact the attending provider between 7A-7P or the covering provider during after hours 7P-7A, please log into the web site [email or web address] and access using universal [place] password for that web site. If you do not have the password, please call the hospital operator.  01/12/2021, 4:10 PM

## 2021-01-12 NOTE — Evaluation (Signed)
Physical Therapy Evaluation Patient Details Name: Brenda Morse MRN: [id number] DOB: 31-Jan-1951 Today's Date: 01/12/2021   History of Present Illness  Brenda Morse is a 70 y.o. female with medical history significant of  significant of diverticular disease, GERD, Hashimoto's thyroiditis, thrombocytopenia, essential tremor, chronic interstitial cystitis, history of gastric ulcer and gastritis, irritable bowel syndrome, B12 deficiency, frequent diarrhea.Found to have  mass in the liver , elevated LFTs concerning for abscess versus malignancy.Positive C diff antigen.  Clinical Impression  The  patient  ambulating  to BR with assistance, used RW this time. Patient should progress to return home. Sister in room and able to assist patient. Patient reports SOB, SPO2 99 on RA, HR 100.Marland Kitchen  Pt admitted with above diagnosis.  Pt currently with functional limitations due to the deficits listed below (see PT Problem List). Pt will benefit from skilled PT to increase their independence and safety with mobility to allow discharge to the venue listed below.        Follow Up Recommendations No PT follow up    Equipment Recommendations  None recommended by PT    Recommendations for Other Services       Precautions / Restrictions Precautions Precautions: Fall Precaution Comments: RUQ drain      Mobility  Bed Mobility Overal bed mobility: Independent                  Transfers Overall transfer level: Needs assistance Equipment used: Rolling walker (2 wheeled) Transfers: Sit to/from Stand Sit to Stand: Min guard         General transfer comment: from bed and toilet  Ambulation/Gait Ambulation/Gait assistance: Min guard Gait Distance (Feet): 20 Feet (x 2) Assistive device: Rolling walker (2 wheeled) Gait Pattern/deviations: Step-through pattern Gait velocity: decr   General Gait Details: moves slowly, guardedly  Stairs            Wheelchair Mobility    Modified Rankin  (Stroke Patients Only)       Balance Overall balance assessment: Needs assistance   Sitting balance-Leahy Scale: Normal     Standing balance support: During functional activity;No upper extremity supported Standing balance-Leahy Scale: Fair                               Pertinent Vitals/Pain Pain Assessment: No/denies pain    Home Living Family/patient expects to be discharged to:: Private residence Living Arrangements: Alone Available Help at Discharge: Family;Available PRN/intermittently Type of Home: House Home Access: Stairs to enter Entrance Stairs-Rails: Doctor, general practice of Steps: 5 Home Layout: One level Home Equipment: None      Prior Function Level of Independence: Independent         Comments: works     Higher education careers adviser        Extremity/Trunk Assessment   Upper Extremity Assessment Upper Extremity Assessment: Overall WFL for tasks assessed    Lower Extremity Assessment Lower Extremity Assessment: Generalized weakness    Cervical / Trunk Assessment Cervical / Trunk Assessment: Normal  Communication   Communication: No difficulties  Cognition Arousal/Alertness: Awake/alert Behavior During Therapy: WFL for tasks assessed/performed Overall Cognitive Status: Within Functional Limits for tasks assessed                                        General Comments      Exercises  Assessment/Plan    PT Assessment Patient needs continued PT services  PT Problem List Decreased strength;Decreased mobility;Decreased activity tolerance       PT Treatment Interventions DME instruction;Therapeutic activities;Gait training;Therapeutic exercise;Patient/family education;Functional mobility training    PT Goals (Current goals can be found in the Care Plan section)  Acute Rehab PT Goals Patient Stated Goal: o go home PT Goal Formulation: With patient/family Time For Goal Achievement: 01/26/21 Potential to  Achieve Goals: Good    Frequency Min 3X/week   Barriers to discharge        Co-evaluation               AM-PAC PT "6 Clicks" Mobility  Outcome Measure Help needed turning from your back to your side while in a flat bed without using bedrails?: None Help needed moving from lying on your back to sitting on the side of a flat bed without using bedrails?: None Help needed moving to and from a bed to a chair (including a wheelchair)?: A Little Help needed standing up from a chair using your arms (e.g., wheelchair or bedside chair)?: A Little Help needed to walk in hospital room?: A Little Help needed climbing 3-5 steps with a railing? : A Little 6 Click Score: 20    End of Session   Activity Tolerance: Patient tolerated treatment well Patient left: in chair;with call bell/phone within reach;with family/visitor present Nurse Communication: Mobility status PT Visit Diagnosis: Unsteadiness on feet (R26.81);Muscle weakness (generalized) (M62.81)    Time: 1751-0258 PT Time Calculation (min) (ACUTE ONLY): 27 min   Charges:   PT Evaluation $PT Eval Low Complexity: 1 Low PT Treatments $Gait Training: 8-22 mins        Blanchard Kelch PT Acute Rehabilitation Services Pager [phone] Office [phone]   Rada Hay 01/12/2021, 2:04 PM

## 2021-01-12 NOTE — Progress Notes (Addendum)
    [facility] has been requested to perform a transesophageal echocardiogram on this patient for bacteremia. Cardmaster received request from Dr. Lowell Guitar. After careful review of history and examination, the risks and benefits of transesophageal echocardiogram have been explained including risks of esophageal damage, perforation (1:10,000 risk), bleeding, pharyngeal hematoma as well as other potential complications associated with conscious sedation including aspiration, arrhythmia, respiratory failure and death. Alternatives to treatment were discussed, questions were answered. Patient is willing to proceed. This is scheduled for tomorrow at 1230 with Dr. Bjorn Pippin, orders under sign/held. Written to keep NPO after midnight except sips with meds.  Laurann Montana, PA-C 01/12/2021 12:20 PM

## 2021-01-12 NOTE — Procedures (Signed)
Patient Name: Brenda Morse  MRN: [id number]  Epilepsy Attending: Charlsie Quest  Referring Physician/Provider: Dr Lacretia Nicks Date: 01/12/2021 Duration: 22.30 mins  Patient history: 70yo M with ams. EEG to evaluate for seizure  Level of alertness: Awake, asleep  AEDs during EEG study: None  Technical aspects: This EEG study was done with scalp electrodes positioned according to the 10-20 International system of electrode placement. Electrical activity was acquired at a sampling rate of 500Hz  and reviewed with a high frequency filter of 70Hz  and a low frequency filter of 1Hz . EEG data were recorded continuously and digitally stored.   Description: The posterior dominant rhythm consists of 9 Hz activity of moderate voltage (25-35 uV) seen predominantly in posterior head regions, symmetric and reactive to eye opening and eye closing. Sleep was characterized by vertex waves, sleep spindles (12 to 14 Hz), maximal frontocentral region. Hyperventilation and photic stimulation were not performed.     IMPRESSION: This study is within normal limits. No seizures or epileptiform discharges were seen throughout the recording.  Brenda Morse 

## 2021-01-12 NOTE — Progress Notes (Signed)
[facility] Infectious Diseases Follow Up Note  Patient Identification: Patient Name: Fiana Gladu MRN: [id number] Admit Date: 01/08/2021  7:53 PM Age: 70 y.o.Today's Date: 01/12/2021   Reason for Visit: Hepatic abscess  Principal Problem:   Liver mass Active Problems:   Allergic rhinitis   ETD (eustachian tube dysfunction)   Hyperlipidemia  Antibiotics: Cefepime 6/16-6/18, Ceftriaxone 6/19-current                     doxycycline 6/17-current                    Metronidazole 6/17-current                    PO vancomycin 6/17-current   Lines/Tubes: External urethral catheter, PIV's   Interval Events: Continues to be afebrile, leukocytosis has been stable.    Assessment Streptococcus intermedius bacteremia.  TTE is negative for vegetations Multiple hepatic abscess with dominant 7 cm complex cystic lesion in the medial liver dome s/p CT guided drain on 6/19. Cultures pending. Path was not sent   C. difficile diarrhea( 1st episode) - improving.  Tells me this is her first episode of C. difficile . she had 1 soft stool yesterday and 1 soft stool this morning.  Denies any abdominal pain/nausea or vomiting  Leukocytosis 2/2 above Transaminitis : improving   History of penicillin allergy: Surprisingly, she mentioned to me she had allergic reaction to penicillin at the age of 1 when she had tonsillectomy.  She broke out into hives.  Did not have any shortness of breath facial or tongue swelling or needing to go to the hospital  History of tick exposure-she states she found a tick in her body in March 2022.  Did not have any rash, did not take any medicine for it.  Unclear significance  Recommendations Continue ceftriaxone and metronidazole as is Continue p.o. vancomycin 500mg  PO QID as is.  Complete a course of 10 days Will do p.o. amoxicillin challenge for penicillin allergy  Complete 10-day course of doxycycline Fu TEE Follow  blood cultures, abscess cultures and OSH blood cultures/sensitivities  Monitor CBC and BMP on IV antibiotics  Rest of the management as per the primary team. Thank you for the consult. Please page with pertinent questions or concerns.  ______________________________________________________________________ Subjective patient seen and examined at the bedside.  She is lying in bed.  Denies any complaints.  Denies any fever chills and sweats.  Denies any nausea vomiting abdominal pain or diarrhea  Vitals BP 131/75 (BP Location: Right Arm)   Pulse 84   Temp 98.1 F (36.7 C)   Resp 18   Ht 5\' 2"  (1.575 m)   Wt 83.8 kg   SpO2 95%   BMI 33.79 kg/m     Physical Exam Constitutional: Lying in bed with no acute distress    Comments:   Cardiovascular:     Rate and Rhythm: Normal rate and regular rhythm.     Heart sounds:   Pulmonary:     Effort: Pulmonary effort is normal.     Comments: Clear lung fields bilaterally  Abdominal:     Palpations: Abdomen is soft.     Tenderness: Nontender and nondistended, right upper quadrant drain  Musculoskeletal:        General: No swelling or tenderness.   Skin:    Comments: No lesions or rashes  Neurological:     General: No focal deficit present.   Psychiatric:  Mood and Affect: Mood normal.   Pertinent Microbiology Results for orders placed or performed during the hospital encounter of 01/08/21  MRSA PCR Screening     Status: None   Collection Time: 01/09/21  4:08 PM  Result Value Ref Range Status   MRSA by PCR NEGATIVE NEGATIVE Final    Comment:        The GeneXpert MRSA Assay (FDA approved for NASAL specimens only), is one component of a comprehensive MRSA colonization surveillance program. It is not intended to diagnose MRSA infection nor to guide or monitor treatment for MRSA infections. Performed at [facility], [address], [place], [place] [address]   Gastrointestinal Panel by PCR ,  Stool     Status: None   Collection Time: 01/11/21  3:25 AM   Specimen: STOOL  Result Value Ref Range Status   Campylobacter species NOT DETECTED NOT DETECTED Final   Plesimonas shigelloides NOT DETECTED NOT DETECTED Final   Salmonella species NOT DETECTED NOT DETECTED Final   Yersinia enterocolitica NOT DETECTED NOT DETECTED Final   Vibrio species NOT DETECTED NOT DETECTED Final   Vibrio cholerae NOT DETECTED NOT DETECTED Final   Enteroaggregative E coli (EAEC) NOT DETECTED NOT DETECTED Final   Enteropathogenic E coli (EPEC) NOT DETECTED NOT DETECTED Final   Enterotoxigenic E coli (ETEC) NOT DETECTED NOT DETECTED Final   Shiga like toxin producing E coli (STEC) NOT DETECTED NOT DETECTED Final   Shigella/Enteroinvasive E coli (EIEC) NOT DETECTED NOT DETECTED Final   Cryptosporidium NOT DETECTED NOT DETECTED Final   Cyclospora cayetanensis NOT DETECTED NOT DETECTED Final   Entamoeba histolytica NOT DETECTED NOT DETECTED Final   Giardia lamblia NOT DETECTED NOT DETECTED Final   Adenovirus F40/41 NOT DETECTED NOT DETECTED Final   Astrovirus NOT DETECTED NOT DETECTED Final   Norovirus GI/GII NOT DETECTED NOT DETECTED Final   Rotavirus A NOT DETECTED NOT DETECTED Final   Sapovirus (I, II, IV, and V) NOT DETECTED NOT DETECTED Final    Comment: Performed at [name], [address] [place], [place] [address]  Aerobic/Anaerobic Culture w Gram Stain (surgical/deep wound)     Status: None (Preliminary result)   Collection Time: 01/11/21  2:14 PM   Specimen: Abscess  Result Value Ref Range Status   Specimen Description   Final    ABSCESS Performed at [facility], [address] [place], [place] [address]    Special Requests   Final    CT HEPATIC DRAIN Performed at [facility], [address], [place], [place] [address]    Gram Stain PENDING  Incomplete   Culture PENDING  Incomplete   Report Status PENDING  Incomplete    Pertinent Lab. CBC  Latest Ref Rng & Units 01/12/2021 01/11/2021 01/10/2021  WBC 4.0 - 10.5 K/uL 21.5(H) 21.7(H) 23.8(H)  Hemoglobin 12.0 - 15.0 g/dL 10.6(L) 10.6(L) 11.2(L)  Hematocrit 36.0 - 46.0 % 32.4(L) 31.8(L) 32.7(L)  Platelets 150 - 400 K/uL 221 217 197   CMP Latest Ref Rng & Units 01/12/2021 01/11/2021 01/10/2021  Glucose 70 - 99 mg/dL 95 98 952(W)  BUN 8 - 23 mg/dL 14 19 41(L)  Creatinine 0.44 - 1.00 mg/dL 2.44 0.10 2.72(Z)  Sodium 135 - 145 mmol/L 138 136 132(L)  Potassium 3.5 - 5.1 mmol/L 3.9 3.8 3.0(L)  Chloride 98 - 111 mmol/L 111 111 101  CO2 22 - 32 mmol/L 20(L) 20(L) 21(L)  Calcium 8.9 - 10.3 mg/dL 7.6(L) 7.5(L) 7.5(L)  Total  Protein 6.5 - 8.1 g/dL 4.9(L) 4.9(L) 5.8(L)  Total Bilirubin 0.3 - 1.2 mg/dL 0.5 0.4 1.0  Alkaline Phos 38 - 126 U/L 122 124 139(H)  AST 15 - 41 U/L 110(H) 124(H) 279(H)  ALT 0 - 44 U/L 273(H) 356(H) 642(H)    Pertinent Imaging today Plain films and CT images have been personally visualized and interpreted; radiology reports have been reviewed. Decision making incorporated into the Impression / Recommendations.  MRI Liver WWO contrast 01/11/21 IMPRESSION: Dominant 7 cm complex cystic lesion in the medial liver dome, with multiple smaller liver lesions showing thin peripheral rim enhancement. These characteristics favor multiple hepatic abscesses over metastatic disease.   Minimal perihepatic ascites, as well as generalized retroperitoneal and body wall edema.   Tiny right pleural effusion.    I have spent more than 35 minutes for this patient encounter including review of prior medical records, coordination of care  with greater than 50% of time being face to face/counseling and discussing diagnostics/treatment plan with the patient/family.  Electronically signed by:   Odette Fraction, MD Infectious Disease Physician [facility] for Infectious Disease Pager: [phone]

## 2021-01-12 NOTE — Progress Notes (Signed)
Referring Physician(s): Dr. Mikeal Hawthorne  Supervising Physician: Marliss Coots  Patient Status:  [facility] - In-pt  Chief Complaint:  Hepatic abscess s/p hepatic abscess drain on 6.19.22 by Dr. Deanne Coffer  Subjective:  Currently without any significant complaints. Patient alert and laying in bed, calm and comfortable. States she is feeling better but reports persistent diarrhea.   Allergies: Penicillins  Medications: Prior to Admission medications   Medication Sig Start Date End Date Taking? Authorizing Provider  albuterol (VENTOLIN HFA) 108 (90 Base) MCG/ACT inhaler Inhale 1 puff into the lungs every 6 (six) hours as needed for wheezing or shortness of breath. 01/05/21  Yes [provider]  atenolol (TENORMIN) 25 MG tablet Take 25 mg by mouth daily. 07/10/20  Yes [provider]  cetirizine (ZYRTEC) 10 MG tablet Take 10 mg by mouth daily.   Yes [provider]  DM-APAP-CPM (CORICIDIN HBP) 10-325-2 MG TABS Take 1 tablet by mouth daily as needed (cough).   Yes [provider]  hydrochlorothiazide (HYDRODIURIL) 12.5 MG tablet Take 12.5 mg by mouth daily. 07/10/20  Yes [provider]  montelukast (SINGULAIR) 10 MG tablet Take 10 mg by mouth daily as needed (allergy). 07/10/20  Yes [provider]  Multiple Vitamin (MULTIVITAMIN ADULT PO) Take 1 tablet by mouth daily.   Yes [provider]  oxymetazoline (AFRIN) 0.05 % nasal spray Place 1 spray into both nostrils daily as needed for congestion.   Yes [provider]     Vital Signs: BP 133/76   Pulse 84   Temp 98.1 F (36.7 C)   Resp 18   Ht 5\' 2"  (1.575 m)   Wt 184 lb 11.9 oz (83.8 kg)   SpO2 97%   BMI 33.79 kg/m   Physical Exam Vitals and nursing note reviewed.  Constitutional:      Appearance: She is well-developed.  HENT:     Head: Normocephalic and atraumatic.  Eyes:     Conjunctiva/sclera: Conjunctivae normal.  Pulmonary:     Effort: Pulmonary  effort is normal.  Abdominal:     Comments: Positive RUQ drain to gravity bag.Site is unremarkable with no erythema, edema, tenderness, bleeding or drainage noted at exit site. Suture and stat lock in place. Dressing is clean dry and intact. 3 ml of  clear brown- tinged colored fluid noted in gravity bag.  Drain is able to be flushed easily.    Musculoskeletal:     Cervical back: Normal range of motion.  Neurological:     Mental Status: She is alert and oriented to person, place, and time.    Imaging: CT HEAD WO CONTRAST  Result Date: 01/09/2021 CLINICAL DATA:  Delirium.  Confusion. EXAM: CT HEAD WITHOUT CONTRAST TECHNIQUE: Contiguous axial images were obtained from the base of the skull through the vertex without intravenous contrast. COMPARISON:  None. FINDINGS: Brain: No acute infarct, hemorrhage, or mass lesion is present. The ventricles are of normal size. No significant extraaxial fluid collection is present. The craniocervical junction is normal. Upper cervical spine is within normal limits. Marrow signal is unremarkable. Vascular: Atherosclerotic calcifications are present within the cavernous internal carotid arteries bilaterally. No hyperdense vessel is present. Skull: Calvarium is intact. No focal lytic or blastic lesions are present. No significant extracranial soft tissue lesion is present. Sinuses/Orbits: The paranasal sinuses and mastoid air cells are clear. The globes and orbits are within normal limits. IMPRESSION: 1. Normal CT appearance of the brain. 2. Atherosclerosis. Electronically Signed   By: 01/11/2021  Mattern M.D.   On: 01/09/2021 15:12   MR BRAIN WO CONTRAST  Result Date: 01/10/2021 CLINICAL DATA:  Delirium. EXAM: MRI HEAD WITHOUT CONTRAST TECHNIQUE: Multiplanar, multiecho pulse sequences of the brain and surrounding structures were obtained without intravenous contrast. COMPARISON:  None. FINDINGS: Brain: No acute infarct, hemorrhage, or mass lesion is present. No  significant white matter lesions are present. The ventricles are of normal size. No significant extraaxial fluid collection is present. The internal auditory canals are within normal limits. The brainstem and cerebellum are within normal limits. Vascular: Flow is present in the major intracranial arteries. Skull and upper cervical spine: The craniocervical junction is normal. Upper cervical spine is within normal limits. Marrow signal is unremarkable. Sinuses/Orbits: The paranasal sinuses and mastoid air cells are clear. The globes and orbits are within normal limits. IMPRESSION: Negative MRI of the brain. Electronically Signed   By: Marin Roberts M.D.   On: 01/10/2021 14:35   MR LIVER WO CONRTAST  Result Date: 01/10/2021 CLINICAL DATA:  Indeterminate hepatic lesions on recent noncontrast CT. Nausea and vomiting. Diarrhea. EXAM: MRI ABDOMEN WITHOUT CONTRAST TECHNIQUE: Multiplanar multisequence MR imaging was performed without the administration of intravenous contrast. COMPARISON:  Noncontrast CT on 01/08/2021 from [facility] FINDINGS: Lower chest: No acute findings. Hepatobiliary: Multiple T2 hyperintense lesions are seen throughout the right and left lobes, largest in the central right hepatic lobe measuring 6.9 x 6.0 cm. These lesions cannot be completely characterized on this unenhanced exam, but are suspicious for metastatic disease with abscess is considered less likely. Gallbladder is unremarkable. No evidence of biliary ductal dilatation. Pancreas: No mass or inflammatory process visualized on this unenhanced exam. Spleen:  Within normal limits in size. Adrenals/Urinary tract: Unremarkable. No evidence of nephrolithiasis or hydronephrosis. Stomach/Bowel: Visualized portion unremarkable. Vascular/Lymphatic: No pathologically enlarged lymph nodes identified. No evidence of abdominal aortic aneurysm. Other:  None. Musculoskeletal:  No suspicious bone lesions identified. IMPRESSION: Multiple  lesions throughout the liver, largest in the central right lobe measuring 6.9 cm. While these lesions cannot be completely characterized on this unenhanced exam, they are suspicious for metastatic disease with abscesses considered less likely. Consider abdomen MRI without and with contrast for further characterization. (Note that, unlike CT contrast, there is no shortage of gadolinium IV contrast for MRI). Electronically Signed   By: Danae Orleans M.D.   On: 01/10/2021 15:00   MR LIVER W WO CONTRAST  Result Date: 01/11/2021 CLINICAL DATA:  Indeterminate liver lesions. EXAM: MRI ABDOMEN WITHOUT AND WITH CONTRAST TECHNIQUE: Multiplanar multisequence MR imaging of the abdomen was performed both before and after the administration of intravenous contrast. CONTRAST:  81mL GADAVIST GADOBUTROL 1 MMOL/ML IV SOLN COMPARISON:  Noncontrast exams on 01/10/2021 and 01/08/2021 FINDINGS: Lower chest: Tiny right pleural effusion noted. Hepatobiliary: A complex cystic lesion is seen in the medial liver dome which shows numerous irregular enhancing internal septations and thin peripheral rim enhancement. This measures 6.8 x 6.3 cm on image 31/23. Multiple other smaller liver lesions are seen in both the right and left lobes which show thin peripheral rim enhancement. No solid liver masses are identified. These characteristics favor multiple hepatic abscesses over metastatic disease. Gallbladder is unremarkable. No evidence of biliary ductal dilatation. Pancreas:  No mass or inflammatory changes. Spleen:  Within normal limits in size and appearance. Adrenals/Urinary Tract: No masses identified. Tiny sub-cm cyst noted in upper pole of right kidney. No evidence of hydronephrosis. Stomach/Bowel: Visualized portion unremarkable. Vascular/Lymphatic: No pathologically enlarged lymph nodes identified. No acute vascular findings.  Other: Minimal perihepatic ascites noted, as well as generalized retroperitoneal and body wall edema.  Musculoskeletal:  No suspicious bone lesions identified. IMPRESSION: Dominant 7 cm complex cystic lesion in the medial liver dome, with multiple smaller liver lesions showing thin peripheral rim enhancement. These characteristics favor multiple hepatic abscesses over metastatic disease. Minimal perihepatic ascites, as well as generalized retroperitoneal and body wall edema. Tiny right pleural effusion. Electronically Signed   By: Danae Orleans M.D.   On: 01/11/2021 10:17   ECHOCARDIOGRAM COMPLETE  Result Date: 01/11/2021    ECHOCARDIOGRAM REPORT   Patient Name:   THEADORA NOYES Date of Exam: 01/11/2021 Medical Rec #:  [id number]     Height:       62.0 in Accession #:    [id number]    Weight:       184.7 lb Date of Birth:  1950/12/31     BSA:          1.848 m Patient Age:    69 years      BP:           124/64 mmHg Patient Gender: F             HR:           83 bpm. Exam Location:  Inpatient Procedure: 2D Echo, 3D Echo, Cardiac Doppler, Color Doppler and Strain Analysis Indications:    Bacteremia R78.81  History:        Patient has no prior history of Echocardiogram examinations.                 GERD, Hashimoto's thyroiditis, Thrombocytopenia, Acute                 vasculopathy sepsis, Strep Intermedius Bacteremia.  Sonographer:    Leta Jungling RDCS Referring Phys: [id number] [facility] IMPRESSIONS  1. Left ventricular ejection fraction, by estimation, is 60 to 65%. Left ventricular ejection fraction by 3D volume is 62 %. The left ventricle has normal function. The left ventricle has no regional wall motion abnormalities. Left ventricular diastolic  parameters are consistent with Grade I diastolic dysfunction (impaired relaxation).  2. Right ventricular systolic function is normal. The right ventricular size is normal.  3. The mitral valve is abnormal. trivial to mild mitral valve regurgitation.  4. The aortic valve is tricuspid. Aortic valve regurgitation is not visualized.  5. The inferior vena cava is normal  in size with <50% respiratory variability, suggesting right atrial pressure of 8 mmHg. Comparison(s): No prior Echocardiogram. Conclusion(s)/Recommendation(s): No evidence of valvular vegetations on this transthoracic echocardiogram. Would recommend a transesophageal echocardiogram to exclude infective endocarditis if clinically indicated. FINDINGS  Left Ventricle: Left ventricular ejection fraction, by estimation, is 60 to 65%. Left ventricular ejection fraction by 3D volume is 62 %. The left ventricle has normal function. The left ventricle has no regional wall motion abnormalities. The left ventricular internal cavity size was normal in size. There is no left ventricular hypertrophy. Left ventricular diastolic parameters are consistent with Grade I diastolic dysfunction (impaired relaxation). Indeterminate filling pressures. Right Ventricle: The right ventricular size is normal. No increase in right ventricular wall thickness. Right ventricular systolic function is normal. Left Atrium: Left atrial size was normal in size. Right Atrium: Right atrial size was normal in size. Pericardium: There is no evidence of pericardial effusion. Mitral Valve: The mitral valve is abnormal. There is mild thickening of the mitral valve leaflet(s). Trivial to mild mitral valve regurgitation, with posteriorly-directed jet. Tricuspid Valve: The tricuspid valve  is grossly normal. Tricuspid valve regurgitation is trivial. Aortic Valve: The aortic valve is tricuspid. Aortic valve regurgitation is not visualized. Pulmonic Valve: The pulmonic valve was grossly normal. Pulmonic valve regurgitation is trivial. Aorta: The aortic root and ascending aorta are structurally normal, with no evidence of dilitation. Venous: The inferior vena cava is normal in size with less than 50% respiratory variability, suggesting right atrial pressure of 8 mmHg. IAS/Shunts: The interatrial septum was not well visualized.  LEFT VENTRICLE PLAX 2D LVIDd:          4.90 cm         Diastology LVIDs:         3.30 cm         LV e' medial:    6.19 cm/s LV PW:         0.80 cm         LV E/e' medial:  11.4 LV IVS:        0.90 cm         LV e' lateral:   9.13 cm/s LVOT diam:     2.00 cm         LV E/e' lateral: 7.7 LV SV:         60 LV SV Index:   33 LVOT Area:     3.14 cm        3D Volume EF                                LV 3D EF:    Left                                             ventricular                                             ejection                                             fraction by                                             3D volume                                             is 62 %.                                 3D Volume EF:                                3D EF:        62 % RIGHT VENTRICLE RV S prime:     12.00 cm/s TAPSE (M-mode): 1.7 cm LEFT ATRIUM  Index       RIGHT ATRIUM          Index LA diam:        3.30 cm 1.79 cm/m  RA Area:     8.17 cm LA Vol (A2C):   26.6 ml 14.39 ml/m RA Volume:   13.90 ml 7.52 ml/m LA Vol (A4C):   27.4 ml 14.82 ml/m LA Biplane Vol: 28.0 ml 15.15 ml/m  AORTIC VALVE LVOT Vmax:   88.60 cm/s LVOT Vmean:  65.100 cm/s LVOT VTI:    0.192 m  AORTA Ao Root diam: 3.00 cm Ao Asc diam:  2.90 cm MITRAL VALVE MV Area (PHT): 5.27 cm    SHUNTS MV Decel Time: 144 msec    Systemic VTI:  0.19 m MV E velocity: 70.70 cm/s  Systemic Diam: 2.00 cm MV A velocity: 86.00 cm/s MV E/A ratio:  0.82 Zoila ShutterKenneth Hilty MD Electronically signed by Zoila ShutterKenneth Hilty MD Signature Date/Time: 01/11/2021/1:15:30 PM    Final     Labs:  CBC: Recent Labs    01/09/21 0548 01/10/21 0252 01/11/21 0914 01/12/21 0552  WBC 18.3* 23.8* 21.7* 21.5*  HGB 11.6* 11.2* 10.6* 10.6*  HCT 33.2* 32.7* 31.8* 32.4*  PLT 153 197 217 221    COAGS: Recent Labs    01/12/21 0552  INR 1.3*    BMP: Recent Labs    01/09/21 2128 01/10/21 0252 01/11/21 0914 01/12/21 0552  NA 129* 132* 136 138  K 3.3* 3.0* 3.8 3.9  CL 98 101 111 111  CO2 20* 21* 20*  20*  GLUCOSE 174* 133* 98 95  BUN 41* 38* 19 14  CALCIUM 7.4* 7.5* 7.5* 7.6*  CREATININE 2.02* 1.72* 0.94 0.98  GFRNONAA 26* 32* >60 >60    LIVER FUNCTION TESTS: Recent Labs    01/09/21 0548 01/10/21 0252 01/11/21 0914 01/12/21 0552  BILITOT 1.7* 1.0 0.4 0.5  AST 795* 279* 124* 110*  ALT 897* 642* 356* 273*  ALKPHOS 118 139* 124 122  PROT 5.8* 5.8* 4.9* 4.9*  ALBUMIN 2.4* 2.3* 2.0* 1.9*    Assessment and Plan:  70 y.o. female inpatient. History of diverticular disease, IBS, GERD, HTN. Seen at OSH for profuse diarrhea up to 40 times daily without nausea or vomiting X 2 weeks. Found to have acute vasculopathy sepsis, a hepatic abscess  and elevated LFT's at OSH. Transfer[facility]16[facility]22 for further evaluation and possible intervention. On 6.19.22 IR placed a hepatic abscess drain.   WBC is 21.5. Microbiology pending. Patient is afebrile.No additional pertinent imaging. Per epic output is 100 ml. Drain to gravity bag with 3 ml of  clear brown- tinged colored fluid noted. Site is unremarkable with no erythema, edema, tenderness, bleeding or drainage noted at exit site. Suture and stat lock in place. Dressing is clean dry and intact.Horace Porteous. Drain is able to be flushed easily.  Recommend team continue with flushing TID, output recording q shift and dressing changes as needed. Would consider additional imaging when output is less than 10 ml for 24 hours not including flush material.   Continue current treatment plans as per Primary Team   Electronically Signed: Alene MiresJennifer C Roper Tolson, NP 01/12/2021, 12:42 PM   I spent a total of 15 Minutes at the patient's bedside AND on the patient's hospital floor or unit, greater than 50% of which was counseling/coordinating care for hepatic abscess drain

## 2021-01-12 NOTE — Progress Notes (Signed)
EEG Completed; Results Pending  

## 2021-01-12 NOTE — H&P (View-Only) (Signed)
PROGRESS NOTE    Brenda Morse  MRN:[id number] DOB: 08/09/1950 DOA: 01/08/2021 PCP: Nodal, Jr Reinaldo, PA-C   No chief complaint on file.  Brief Narrative:  Brenda Morse is Brenda Morse 69 y.o. female with medical history significant of  significant of diverticular disease, GERD, Hashimoto's thyroiditis, thrombocytopenia, essential tremor, chronic interstitial cystitis, history of gastric ulcer and gastritis, irritable bowel syndrome, B12 deficiency, who was seen at [facility] with acute vasculopathy sepsis elevated for work-up.  Patient was seen by medical service.  Consultation.  She apparently has been having profuse diarrhea up to 40 times Brenda Morse day with nausea but no vomiting.  This been going on for over 2 weeks.  Denied any significant abdominal pain.  No urinary symptoms.  Patient had colonoscopy about 2 and half months ago by Dr. Gupta Nothing was seen at the time except for diverticular disease.  But she came in today with fever and chills.  COVID-19 was negative.  Patient was evaluated and found to have alert several centimeter mass in the liver with another small liver mass.  Also elevated LFTs concerning for abscess versus malignancy.  Patient had Brenda Morse white count of 23,000.  Temperature 1.3.  She also had urinalysis that is nondiagnostic.  Lactic acid was 1.3 magnesium 2.1.  Tylenol level was negative.  Her AST was 30-55 ALT 1481 total bilirubin 2.3.  She is also in acute kidney injury with creatinine 2.10 glucose 163 BUN 27.  Her hemoglobin was found to be 10.9.  Based on her findings patient is suspected to have liver abscess.  She was therefore transferred here for further evaluation and treatment.  She has underlying history of depression as well but appears stable here.  She reported no diarrhea since arrival here at .  Also no nausea or vomiting at this point.  Patient is being admitted with expected GI follow-up..   Assessment & Plan:   Principal Problem:   Liver mass Active  Problems:   Allergic rhinitis   ETD (eustachian tube dysfunction)   Hyperlipidemia  Multiple Hepatic Abscesses  Strep Intermedius Bacteremia  Sepsis  Sepsis physiology resolved at this time CT concerning for hypoattenuating masses in liver and focal nodular wall thickening in sigmoid colon (see below) Follow MRI liver (without contrast given renal function) - with multiple lesions throughout liver, largest in central R lobe measuring 6.9 cm - incompletely char on unenhanced exam MRI liver with/without contrast 6/19 with dominant 7 cm complex cystic lesion in the medial liver dome with multiple smaller liver lesions showing thin peripheral rim enhancement (favors multiple hepatic abscesses over metastatic disease).  Minimal perihepatic ascites as well as generalized retroperitoneal and body wall edema.   Continue ceftriaxone, flagyl.  Getting amoxicillin challenge per ID. Follow GI pathogen panel negative  Follow blood Morse pending Brenda Morse 2/2 strep intermedius (will need to have them fax paperwork over again as I don't see in chart).  1/2 staph epi, suspect contaminant.   Follow echocardiogram -> no evidence valvular vegetation Plan for TEE 6/21 Elevated procal, ESR, CRP.  Leukocytosis.  Procal downtrending today, still significantly elevated.  ID c/s, appreciate recs Consider GI c/s S/p CT guided liver abscess drain on 6/19  Follow aspiration aerobic/anaerobic culture from 6/19  Addendum - clarified with pt, dental work (implant) in November - had several check ups since then, but sounds like extent was flossing and evaluation, not much more  CT from OSH with new multiple hypoattenuating masses in the liver, largest measuring up   to 6.3 cm, concerning for metastataic disease or possibly intrahepatic abscesses given the patient's leukocytosis and elevated liver enzymes.  Focal nodular wall thickening in the sigmoid colon with mild adjacent stranding which could represent  [name] colonic malignancy or short segment diverticulitis.  Recommend multiphase contrast enhanced MRI when the patient is stable and can tolerate Jobany Montellano breath hold to further evaluate these liver masses.  Nondilated gallbladder with nonspecific wall thickening and non visible radioopaque stones.  Cholecystitis vs hepatocellular disease.  Small hiatal hernia.  Likely postinfectious/postinflammatory scarring and atelectasis in lower lungs, R>L.  Elevated Liver Enzymes Likely related to above - gradually improving Follow acute hepatitis studies (negative) and liver MRI (as above)  Concern for tick borne illness Doxy Appreciate ID recs  Acute Kidney Injury  Hyponatremia Improved after resuscitation/abx  Nausea  Vomiting  Diarrhea  Positive C diff antigen, negative toxin, positive PCR With positive c diff antigen and PCR, will treat with oral vanc Likely 2/2 above Treating for severe c diff, appreciate ID recs (500 qid vanc + flagyl) Given focal nodular wall thickening in CT from OSH, consider colonoscopy [name]  Acute Metabolic encephalopathy Appears essentially completely resolved today Follow head CT - normal CT appearance of brain Normal ammonia, TSH, folate.  Elevated B12. EEG -> will cancel this given improvement in mental status Suspect this is 2/2 infection above.  Delirium precautions Follow MRI negative -- consider MRI with contrast, but given her significant improvement, will hold off at this time  Hypertension Holding atenolol and HCTZ for now  Hx Hashimoto's thyroiditis Normal TSH, not on meds  Hx Essential Tremor  Hx Chronic Intersitial Cystitis  Hx Gastric Ulcer  Gastritis  Holding singulair, zyrtec, afrin for now  DVT prophylaxis:lovenox Code Status: full  Family Communication: discussed with sister at bedside Disposition:   Status is: Inpatient  Remains inpatient appropriate because:Inpatient level of care appropriate due to severity of illness  Dispo: The  patient is from: Home              Anticipated d/c is to: Home              Patient currently is not medically stable to d/c.   Difficult to place patient No       Consultants:  ID  Procedures:  Echo IMPRESSIONS     1. Left ventricular ejection fraction, by estimation, is 60 to 65%. Left  ventricular ejection fraction by 3D volume is 62 %. The left ventricle has  normal function. The left ventricle has no regional wall motion  abnormalities. Left ventricular diastolic   parameters are consistent with Grade I diastolic dysfunction (impaired  relaxation).   2. Right ventricular systolic function is normal. The right ventricular  size is normal.   3. The mitral valve is abnormal. trivial to mild mitral valve  regurgitation.   4. The aortic valve is tricuspid. Aortic valve regurgitation is not  visualized.   5. The inferior vena cava is normal in size with <50% respiratory  variability, suggesting right atrial pressure of 8 mmHg.   Comparison(s): No prior Echocardiogram.   Conclusion(s)/Recommendation(s): No evidence of valvular vegetations on  this transthoracic echocardiogram. Would recommend Kamill Fulbright transesophageal  echocardiogram to exclude infective endocarditis if clinically indicated.   6/19 CT guided liver abscess drain          Antimicrobials:  Anti-infectives (From admission, onward)    Start     Dose/Rate Route Frequency Ordered Stop   01/12/21 1115  amoxicillin (AMOXIL) capsule  500 mg        500 mg Oral  Once 01/12/21 1016 01/12/21 1118   01/11/21 1100  cefTRIAXone (ROCEPHIN) 2 g in sodium chloride 0.9 % 100 mL IVPB        2 g 200 mL/hr over 30 Minutes Intravenous Daily 01/11/21 0951     01/10/21 0000  ceFEPIme (MAXIPIME) 2 g in sodium chloride 0.9 % 100 mL IVPB  Status:  Discontinued        2 g 200 mL/hr over 30 Minutes Intravenous Every 24 hours 01/09/21 0037 01/09/21 1547   01/09/21 2200  ceFEPIme (MAXIPIME) 2 g in sodium chloride 0.9 % 100 mL IVPB  Status:   Discontinued        2 g 200 mL/hr over 30 Minutes Intravenous Every 24 hours 01/09/21 1628 01/11/21 0951   01/09/21 1800  vancomycin (VANCOCIN) capsule 500 mg        500 mg Oral 4 times daily 01/09/21 1631     01/09/21 1700  doxycycline (VIBRAMYCIN) 100 mg in sodium chloride 0.9 % 250 mL IVPB        100 mg 125 mL/hr over 120 Minutes Intravenous Every 12 hours 01/09/21 1547     01/09/21 1700  metroNIDAZOLE (FLAGYL) IVPB 500 mg        500 mg 100 mL/hr over 60 Minutes Intravenous Every 8 hours 01/09/21 1609     01/09/21 1545  doxycycline (VIBRAMYCIN) 100 mg in sodium chloride 0.9 % 250 mL IVPB  Status:  Discontinued        100 mg 125 mL/hr over 120 Minutes Intravenous 2 times daily 01/09/21 1528 01/09/21 1535   01/09/21 1015  vancomycin (VANCOCIN) capsule 125 mg  Status:  Discontinued        125 mg Oral 4 times daily 01/09/21 0928 01/09/21 1631   01/09/21 0800  metroNIDAZOLE (FLAGYL) IVPB 500 mg  Status:  Discontinued        500 mg 100 mL/hr over 60 Minutes Intravenous Every 8 hours 01/09/21 0744 01/09/21 1547   01/09/21 0009  vancomycin variable dose per unstable renal function (pharmacist dosing)  Status:  Discontinued         Does not apply See admin instructions 01/09/21 0009 01/09/21 1147   01/08/21 2230  vancomycin (VANCOREADY) IVPB 1250 mg/250 mL        1,250 mg 166.7 mL/hr over 90 Minutes Intravenous  Once 01/08/21 2121 01/09/21 1741   01/08/21 2200  ceFEPIme (MAXIPIME) 2 g in sodium chloride 0.9 % 100 mL IVPB        2 g 200 mL/hr over 30 Minutes Intravenous NOW 01/08/21 2117 01/09/21 1605       Subjective: No new complaints today  Objective: Vitals:   01/12/21 1100 01/12/21 1143 01/12/21 1241 01/12/21 1438  BP: 131/75 133/76 (!) 142/75 (!) 143/78  Pulse: 84   90  Resp: 18     Temp: 98.1 F (36.7 C)   98.4 F (36.9 C)  TempSrc:    Oral  SpO2: 95% 97% 96% 96%  Weight:      Height:        Intake/Output Summary (Last 24 hours) at 01/12/2021 1610 Last data filed at  01/12/2021 1000 Gross per 24 hour  Intake 1252.72 ml  Output 100 ml  Net 1152.72 ml   Filed Weights   01/08/21 2123 01/10/21 0400 01/11/21 0142  Weight: 81.6 kg 82.8 kg 83.8 kg    Examination:  General: No acute distress. Cardiovascular: Heart   sounds show Supriya Beaston regular rate, and rhythm. Lungs: Clear to auscultation bilaterally Abdomen: Soft, nontender, nondistended  Neurological: Alert and oriented 3. Moves all extremities 4 . Cranial nerves II through XII grossly intact. Skin: Warm and dry. No rashes or lesions. Extremities: No clubbing or cyanosis. No edema.   Data Reviewed: I have personally reviewed following labs and imaging studies  CBC: Recent Labs  Lab 01/08/21 2107 01/09/21 0548 01/10/21 0252 01/11/21 0914 01/12/21 0552  WBC 17.8* 18.3* 23.8* 21.7* 21.5*  NEUTROABS  --   --   --  18.4* 17.4*  HGB 10.2* 11.6* 11.2* 10.6* 10.6*  HCT 29.3* 33.2* 32.7* 31.8* 32.4*  MCV 82.3 82.4 81.5 84.8 86.2  PLT 141* 153 197 217 221    Basic Metabolic Panel: Recent Labs  Lab 01/09/21 1400 01/09/21 2128 01/10/21 0252 01/11/21 0914 01/12/21 0552  NA 126* 129* 132* 136 138  K 2.9* 3.3* 3.0* 3.8 3.9  CL 88* 98 101 111 111  CO2 22 20* 21* 20* 20*  GLUCOSE 84 174* 133* 98 95  BUN 41* 41* 38* 19 14  CREATININE 2.68* 2.02* 1.72* 0.94 0.98  CALCIUM 7.5* 7.4* 7.5* 7.5* 7.6*  MG  --   --  2.3  --  1.9  PHOS  --  3.6  --   --  2.2*    GFR: Estimated Creatinine Clearance: 53.6 mL/min (by C-G formula based on SCr of 0.98 mg/dL).  Liver Function Tests: Recent Labs  Lab 01/09/21 0548 01/10/21 0252 01/11/21 0914 01/12/21 0552  AST 795* 279* 124* 110*  ALT 897* 642* 356* 273*  ALKPHOS 118 139* 124 122  BILITOT 1.7* 1.0 0.4 0.5  PROT 5.8* 5.8* 4.9* 4.9*  ALBUMIN 2.4* 2.3* 2.0* 1.9*    CBG: Recent Labs  Lab 01/09/21 0231  GLUCAP 93     Recent Results (from the past 240 hour(s))  MRSA PCR Screening     Status: None   Collection Time: 01/09/21  4:08 PM  Result  Value Ref Range Status   MRSA by PCR NEGATIVE NEGATIVE Final    Comment:        The GeneXpert MRSA Assay (FDA approved for NASAL specimens only), is one component of Riccardo Holeman comprehensive MRSA colonization surveillance program. It is not intended to diagnose MRSA infection nor to guide or monitor treatment for MRSA infections. Performed at [facility], [address] [place], [place] [address]   Gastrointestinal Panel by PCR , Stool     Status: None   Collection Time: 01/11/21  3:25 AM   Specimen: STOOL  Result Value Ref Range Status   Campylobacter species NOT DETECTED NOT DETECTED Final   Plesimonas shigelloides NOT DETECTED NOT DETECTED Final   Salmonella species NOT DETECTED NOT DETECTED Final   Yersinia enterocolitica NOT DETECTED NOT DETECTED Final   Vibrio species NOT DETECTED NOT DETECTED Final   Vibrio cholerae NOT DETECTED NOT DETECTED Final   Enteroaggregative E coli (EAEC) NOT DETECTED NOT DETECTED Final   Enteropathogenic E coli (EPEC) NOT DETECTED NOT DETECTED Final   Enterotoxigenic E coli (ETEC) NOT DETECTED NOT DETECTED Final   Shiga like toxin producing E coli (STEC) NOT DETECTED NOT DETECTED Final   Shigella/Enteroinvasive E coli (EIEC) NOT DETECTED NOT DETECTED Final   Cryptosporidium NOT DETECTED NOT DETECTED Final   Cyclospora cayetanensis NOT DETECTED NOT DETECTED Final   Entamoeba histolytica NOT DETECTED NOT DETECTED Final   Giardia lamblia NOT DETECTED NOT DETECTED Final   Adenovirus F40/41 NOT   DETECTED NOT DETECTED Final   Astrovirus NOT DETECTED NOT DETECTED Final   Norovirus GI/GII NOT DETECTED NOT DETECTED Final   Rotavirus [place]en NOT DETECTED NOT DETECTED Final   Sapovirus (I, II, IV, and V) NOT DETECTED NOT DETECTED Final    Comment: Performed at [facility], [address] [place], [place] [address]  Aerobic/Anaerobic Culture w Gram Stain (surgical/deep wound)     Status: None (Preliminary result)   Collection Time:  01/11/21  2:14 PM   Specimen: Abscess  Result Value Ref Range Status   Specimen Description   Final    ABSCESS Performed at [facility], [address] [place], [place] [address]    Special Requests CT HEPATIC DRAIN  Final   Gram Stain PENDING  Incomplete   Culture   Final    NO GROWTH < 24 HOURS Performed at [facility], [address] [place], [place] [address]    Report Status PENDING  Incomplete         Radiology Studies: MR LIVER W WO CONTRAST  Result Date: 01/11/2021 CLINICAL DATA:  Indeterminate liver lesions. EXAM: MRI ABDOMEN WITHOUT AND WITH CONTRAST TECHNIQUE: Multiplanar multisequence MR imaging of the abdomen was performed both before and after the administration of intravenous contrast. CONTRAST:  8mL GADAVIST GADOBUTROL 1 MMOL/ML IV SOLN COMPARISON:  Noncontrast exams on 01/10/2021 and 01/08/2021 FINDINGS: Lower chest: Tiny right pleural effusion noted. Hepatobiliary: Ambrielle Kington complex cystic lesion is seen in the medial liver dome which shows numerous irregular enhancing internal septations and thin peripheral rim enhancement. This measures 6.8 x 6.3 cm on image 31/23. Multiple other smaller liver lesions are seen in both the right and left lobes which show thin peripheral rim enhancement. No solid liver masses are identified. These characteristics favor multiple hepatic abscesses over metastatic disease. Gallbladder is unremarkable. No evidence of biliary ductal dilatation. Pancreas:  No mass or inflammatory changes. Spleen:  Within normal limits in size and appearance. Adrenals/Urinary Tract: No masses identified. Tiny sub-cm cyst noted in upper pole of right kidney. No evidence of hydronephrosis. Stomach/Bowel: Visualized portion unremarkable. Vascular/Lymphatic: No pathologically enlarged lymph nodes identified. No acute vascular findings. Other: Minimal perihepatic ascites noted, as well as generalized retroperitoneal and body wall edema.  Musculoskeletal:  No suspicious bone lesions identified. IMPRESSION: Dominant 7 cm complex cystic lesion in the medial liver dome, with multiple smaller liver lesions showing thin peripheral rim enhancement. These characteristics favor multiple hepatic abscesses over metastatic disease. Minimal perihepatic ascites, as well as generalized retroperitoneal and body wall edema. Tiny right pleural effusion. Electronically Signed   By: John Markhi Kleckner Stahl M.D.   On: 01/11/2021 10:17   ECHOCARDIOGRAM COMPLETE  Result Date: 01/11/2021    ECHOCARDIOGRAM REPORT   Patient Name:   Charla Gentzler Date of Exam: 01/11/2021 Medical Rec #:  [id number]     Height:       62.0 in Accession #:    [id number]    Weight:       184.7 lb Date of Birth:  04/13/1951     BSA:          1.848 m Patient Age:    69 years      BP:           124/64 mmHg Patient Gender: F             HR:           83 bpm. Exam Location:  Inpatient Procedure: 2D Echo, 3D Echo,   Cardiac Doppler, Color Doppler and Strain Analysis Indications:    Bacteremia R78.81  History:        Patient has no prior history of Echocardiogram examinations.                 GERD, Hashimoto's thyroiditis, Thrombocytopenia, Acute                 vasculopathy sepsis, Strep Intermedius Bacteremia.  Sonographer:    Darlina Sicilian RDCS Referring Phys: [id number] [place]  1. Left ventricular ejection fraction, by estimation, is 60 to 65%. Left ventricular ejection fraction by 3D volume is 62 %. The left ventricle has normal function. The left ventricle has no regional wall motion abnormalities. Left ventricular diastolic  parameters are consistent with Grade I diastolic dysfunction (impaired relaxation).  2. Right ventricular systolic function is normal. The right ventricular size is normal.  3. The mitral valve is abnormal. trivial to mild mitral valve regurgitation.  4. The aortic valve is tricuspid. Aortic valve regurgitation is not visualized.  5. The inferior vena cava is normal  in size with <50% respiratory variability, suggesting right atrial pressure of 8 mmHg. Comparison(s): No prior Echocardiogram. Conclusion(s)/Recommendation(s): No evidence of valvular vegetations on this transthoracic echocardiogram. Would recommend Shaniya Tashiro transesophageal echocardiogram to exclude infective endocarditis if clinically indicated. FINDINGS  Left Ventricle: Left ventricular ejection fraction, by estimation, is 60 to 65%. Left ventricular ejection fraction by 3D volume is 62 %. The left ventricle has normal function. The left ventricle has no regional wall motion abnormalities. The left ventricular internal cavity size was normal in size. There is no left ventricular hypertrophy. Left ventricular diastolic parameters are consistent with Grade I diastolic dysfunction (impaired relaxation). Indeterminate filling pressures. Right Ventricle: The right ventricular size is normal. No increase in right ventricular wall thickness. Right ventricular systolic function is normal. Left Atrium: Left atrial size was normal in size. Right Atrium: Right atrial size was normal in size. Pericardium: There is no evidence of pericardial effusion. Mitral Valve: The mitral valve is abnormal. There is mild thickening of the mitral valve leaflet(s). Trivial to mild mitral valve regurgitation, with posteriorly-directed jet. Tricuspid Valve: The tricuspid valve is grossly normal. Tricuspid valve regurgitation is trivial. Aortic Valve: The aortic valve is tricuspid. Aortic valve regurgitation is not visualized. Pulmonic Valve: The pulmonic valve was grossly normal. Pulmonic valve regurgitation is trivial. Aorta: The aortic root and ascending aorta are structurally normal, with no evidence of dilitation. Venous: The inferior vena cava is normal in size with less than 50% respiratory variability, suggesting right atrial pressure of 8 mmHg. IAS/Shunts: The interatrial septum was not well visualized.  LEFT VENTRICLE PLAX 2D LVIDd:          4.90 cm         Diastology LVIDs:         3.30 cm         LV e' medial:    6.19 cm/s LV PW:         0.80 cm         LV E/e' medial:  11.4 LV IVS:        0.90 cm         LV e' lateral:   9.13 cm/s LVOT diam:     2.00 cm         LV E/e' lateral: 7.7 LV SV:         60 LV SV Index:   33 LVOT Area:     3.14  cm        3D Volume EF                                LV 3D EF:    Left                                             ventricular                                             ejection                                             fraction by                                             3D volume                                             is 62 %.                                 3D Volume EF:                                3D EF:        62 % RIGHT VENTRICLE RV S prime:     12.00 cm/s TAPSE (M-mode): 1.7 cm LEFT ATRIUM             Index       RIGHT ATRIUM          Index LA diam:        3.30 cm 1.79 cm/m  RA Area:     8.17 cm LA Vol (A2C):   26.6 ml 14.39 ml/m RA Volume:   13.90 ml 7.52 ml/m LA Vol (A4C):   27.4 ml 14.82 ml/m LA Biplane Vol: 28.0 ml 15.15 ml/m  AORTIC VALVE LVOT Vmax:   88.60 cm/s LVOT Vmean:  65.100 cm/s LVOT VTI:    0.192 m  AORTA Ao Root diam: 3.00 cm Ao Asc diam:  2.90 cm MITRAL VALVE MV Area (PHT): 5.27 cm    SHUNTS MV Decel Time: 144 msec    Systemic VTI:  0.19 m MV E velocity: 70.70 cm/s  Systemic Diam: 2.00 cm MV Ovadia Lopp velocity: 86.00 cm/s MV E/Shyana Kulakowski ratio:  0.82 Kenneth Hilty MD Electronically signed by Kenneth Hilty MD Signature Date/Time: 01/11/2021/1:15:30 PM    Final         Scheduled Meds:  Chlorhexidine Gluconate Cloth  6 each Topical Daily   melatonin  3 mg Oral QHS   sodium chloride flush  5 mL Intracatheter Q8H   [START ON 01/13/2021] thiamine injection  100 mg   Intravenous Daily   vancomycin  500 mg Oral QID   Continuous Infusions:  cefTRIAXone (ROCEPHIN)  IV 2 g (01/12/21 1146)   dextrose 5 % and 0.9 % NaCl with KCl 20 mEq/L 100 mL/hr at 01/11/21 2140   doxycycline  (VIBRAMYCIN) IV 100 mg (01/12/21 0508)   metronidazole 500 mg (01/12/21 0832)     LOS: 4 days    Time spent: over 30 min    Caldwell Powell, MD [facility]   To contact the attending provider between 7A-7P or the covering provider during after hours 7P-7A, please log into the web site [email or web address] and access using universal [place] password for that web site. If you do not have the password, please call the hospital operator.  01/12/2021, 4:10 PM    

## 2021-01-12 NOTE — Progress Notes (Signed)
[facility] transport scheduled for Tuesday 6/21 to take to [facility] for TEE. Patient to be at [facility] at 1130.

## 2021-01-13 ENCOUNTER — Inpatient Hospital Stay (HOSPITAL_COMMUNITY): Payer: 59

## 2021-01-13 ENCOUNTER — Encounter (HOSPITAL_COMMUNITY): Payer: Self-pay | Admitting: Internal Medicine

## 2021-01-13 ENCOUNTER — Inpatient Hospital Stay (HOSPITAL_COMMUNITY): Payer: 59 | Admitting: Anesthesiology

## 2021-01-13 ENCOUNTER — Encounter (HOSPITAL_COMMUNITY)
Admission: AD | Disposition: A | Discharge status: Home Health | Payer: Self-pay | Source: Other Acute Inpatient Hospital | Attending: Family Medicine

## 2021-01-13 DIAGNOSIS — I361 Nonrheumatic tricuspid (valve) insufficiency: Secondary | ICD-10-CM

## 2021-01-13 DIAGNOSIS — I340 Nonrheumatic mitral (valve) insufficiency: Secondary | ICD-10-CM | POA: Diagnosis not present

## 2021-01-13 DIAGNOSIS — R7881 Bacteremia: Secondary | ICD-10-CM

## 2021-01-13 HISTORY — PX: TEE WITHOUT CARDIOVERSION: SHX5443

## 2021-01-13 LAB — MAGNESIUM: Magnesium: 1.7 mg/dL (ref 1.7–2.4)

## 2021-01-13 LAB — COMPREHENSIVE METABOLIC PANEL
ALT: 179 U/L — ABNORMAL HIGH (ref 0–44)
AST: 44 U/L — ABNORMAL HIGH (ref 15–41)
Albumin: 1.9 g/dL — ABNORMAL LOW (ref 3.5–5.0)
Alkaline Phosphatase: 125 U/L (ref 38–126)
Anion gap: 6 (ref 5–15)
BUN: 11 mg/dL (ref 8–23)
CO2: 20 mmol/L — ABNORMAL LOW (ref 22–32)
Calcium: 7.7 mg/dL — ABNORMAL LOW (ref 8.9–10.3)
Chloride: 114 mmol/L — ABNORMAL HIGH (ref 98–111)
Creatinine, Ser: 0.77 mg/dL (ref 0.44–1.00)
GFR, Estimated: >60 mL/min (ref >60)
Glucose, Bld: 106 mg/dL — ABNORMAL HIGH (ref 70–99)
Potassium: 3.8 mmol/L (ref 3.5–5.1)
Sodium: 140 mmol/L (ref 135–145)
Total Bilirubin: 0.5 mg/dL (ref 0.3–1.2)
Total Protein: 4.7 g/dL — ABNORMAL LOW (ref 6.5–8.1)

## 2021-01-13 LAB — CBC WITH DIFFERENTIAL/PLATELET
Abs Immature Granulocytes: 1.88 10*3/uL — ABNORMAL HIGH (ref 0.00–0.07)
Basophils Absolute: 0.2 10*3/uL — ABNORMAL HIGH (ref 0.0–0.1)
Basophils Relative: 1 %
Eosinophils Absolute: 0.3 10*3/uL (ref 0.0–0.5)
Eosinophils Relative: 2 %
HCT: 31.9 % — ABNORMAL LOW (ref 36.0–46.0)
Hemoglobin: 10.5 g/dL — ABNORMAL LOW (ref 12.0–15.0)
Immature Granulocytes: 10 %
Lymphocytes Relative: 9 %
Lymphs Abs: 1.6 10*3/uL (ref 0.7–4.0)
MCH: 28.1 pg (ref 26.0–34.0)
MCHC: 32.9 g/dL (ref 30.0–36.0)
MCV: 85.3 fL (ref 80.0–100.0)
Monocytes Absolute: 0.8 10*3/uL (ref 0.1–1.0)
Monocytes Relative: 4 %
Neutro Abs: 13.5 10*3/uL — ABNORMAL HIGH (ref 1.7–7.7)
Neutrophils Relative %: 74 %
Platelets: 245 10*3/uL (ref 150–400)
RBC: 3.74 MIL/uL — ABNORMAL LOW (ref 3.87–5.11)
RDW: 14.9 % (ref 11.5–15.5)
WBC: 18.2 10*3/uL — ABNORMAL HIGH (ref 4.0–10.5)
nRBC: 0.1 % (ref 0.0–0.2)

## 2021-01-13 LAB — PHOSPHORUS: Phosphorus: 2.9 mg/dL (ref 2.5–4.6)

## 2021-01-13 SURGERY — ECHOCARDIOGRAM, TRANSESOPHAGEAL
Anesthesia: Monitor Anesthesia Care

## 2021-01-13 MED ORDER — PHENYLEPHRINE 40 MCG/ML (10ML) SYRINGE FOR IV PUSH (FOR BLOOD PRESSURE SUPPORT)
PREFILLED_SYRINGE | INTRAVENOUS | Status: DC | PRN
  Administered 2021-01-13: 80 ug via INTRAVENOUS
  Administered 2021-01-13: 40 ug via INTRAVENOUS
  Administered 2021-01-13: 120 ug via INTRAVENOUS
  Administered 2021-01-13 (×2): 80 ug via INTRAVENOUS

## 2021-01-13 MED ORDER — SODIUM CHLORIDE 0.9 % IV SOLN: INTRAVENOUS | Status: DC

## 2021-01-13 MED ORDER — PROPOFOL 10 MG/ML IV BOLUS
INTRAVENOUS | Status: DC | PRN
  Administered 2021-01-13: 30 mg via INTRAVENOUS

## 2021-01-13 MED ORDER — LORATADINE 10 MG PO TABS
10.0000 mg | ORAL_TABLET | Freq: Every day | ORAL | Status: DC
  Administered 2021-01-13 – 2021-01-15 (×3): 10 mg via ORAL
  Filled 2021-01-13 (×3): qty 1

## 2021-01-13 MED ORDER — PHENYLEPHRINE HCL-NACL 10-0.9 MG/250ML-% IV SOLN
INTRAVENOUS | Status: DC | PRN
  Administered 2021-01-13: 50 ug/min via INTRAVENOUS

## 2021-01-13 MED ORDER — IOHEXOL 350 MG/ML SOLN
100.0000 mL | Freq: Once | INTRAVENOUS | Status: AC | PRN
  Administered 2021-01-13: 80 mL via INTRAVENOUS

## 2021-01-13 MED ORDER — SODIUM CHLORIDE 0.9 % IV SOLN: INTRAVENOUS | Status: DC | PRN

## 2021-01-13 MED ORDER — SALINE SPRAY 0.65 % NA SOLN
1.0000 | NASAL | Status: DC | PRN
  Filled 2021-01-13: qty 44

## 2021-01-13 MED ORDER — PROPOFOL 500 MG/50ML IV EMUL
INTRAVENOUS | Status: DC | PRN
  Administered 2021-01-13: 125 ug/kg/min via INTRAVENOUS

## 2021-01-13 NOTE — Transfer of Care (Signed)
Immediate Anesthesia Transfer of Care Note  Patient: Brenda Morse  Procedure(s) Performed: TRANSESOPHAGEAL ECHOCARDIOGRAM (TEE)  Patient Location: Endoscopy Unit  Anesthesia Type:MAC  Level of Consciousness: drowsy and patient cooperative  Airway & Oxygen Therapy: Patient Spontanous Breathing and Patient connected to nasal cannula oxygen  Post-op Assessment: Report given to RN and Post -op Vital signs reviewed and stable  Post vital signs: Reviewed and stable  Last Vitals:  Vitals Value Taken Time  BP 121/56 01/13/21 1337  Temp 36.7 C 01/13/21 1337  Pulse 77 01/13/21 1339  Resp 22 01/13/21 1339  SpO2 95 % 01/13/21 1339  Vitals shown include unvalidated device data.  Last Pain:  Vitals:   01/13/21 1337  TempSrc: Axillary  PainSc: 0-No pain         Complications: No notable events documented.

## 2021-01-13 NOTE — Progress Notes (Addendum)
PROGRESS NOTE    Brenda Morse  TMH:[id number] DOB: 12-21-1950 DOA: 01/08/2021 PCP: Maggie Schwalbe, PA-C   No chief complaint on file.  Brief Narrative:  Brenda Morse is Brenda Morse 70 y.o. female with medical history significant of  significant of diverticular disease, GERD, Hashimoto's thyroiditis, thrombocytopenia, essential tremor, chronic interstitial cystitis, history of gastric ulcer and gastritis, irritable bowel syndrome, B12 deficiency, who was seen at [facility] with acute vasculopathy sepsis elevated for work-up.  Patient was seen by medical service.  Consultation.  She apparently has been having profuse diarrhea up to 40 times Brenda Morse day with nausea but no vomiting.  This been going on for over 2 weeks.  Denied any significant abdominal pain.  No urinary symptoms.  Patient had colonoscopy about 2 and half months ago by Dr. Lyndel Safe Nothing was seen at the time except for diverticular disease.  But she came in today with fever and chills.  COVID-19 was negative.  Patient was evaluated and found to have alert several centimeter mass in the liver with another small liver mass.  Also elevated LFTs concerning for abscess versus malignancy.  Patient had Brenda Morse white count of 23,000.  Temperature 1.3.  She also had urinalysis that is nondiagnostic.  Lactic acid was 1.3 magnesium 2.1.  Tylenol level was negative.  Her AST was 30-55 ALT 1481 total bilirubin 2.3.  She is also in acute kidney injury with creatinine 2.10 glucose 163 BUN 27.  Her hemoglobin was found to be 10.9.  Based on her findings patient is suspected to have liver abscess.  She was therefore transferred here for further evaluation and treatment.  She has underlying history of depression as well but appears stable here.  She reported no diarrhea since arrival here at [facility] long.  Also no nausea or vomiting at this point.  Patient is being admitted with expected GI follow-up.Marland Kitchen  She was admitted with CT findings concerning for metastatic disease  vs intrahepatic abscesses.  OSH blood cultures were positive for strep intermedius and MRI with/without contrast concerning for abscesses.  IR c/s and placed drain.  S/p TEE 6/21.  ID following.   Assessment & Plan:   Principal Problem:   Liver mass Active Problems:   Allergic rhinitis   ETD (eustachian tube dysfunction)   Hyperlipidemia   Bacteremia  Multiple Hepatic Abscesses  Strep Intermedius Bacteremia  Sepsis  Sepsis physiology resolved at this time CT concerning for hypoattenuating masses in liver and focal nodular wall thickening in sigmoid colon (see below) Follow MRI liver (without contrast given renal function) - with multiple lesions throughout liver, largest in central R lobe measuring 6.9 cm - incompletely char on unenhanced exam MRI liver with/without contrast 6/19 with dominant 7 cm complex cystic lesion in the medial liver dome with multiple smaller liver lesions showing thin peripheral rim enhancement (favors multiple hepatic abscesses over metastatic disease).  Minimal perihepatic ascites as well as generalized retroperitoneal and body wall edema.   Continue ceftriaxone, flagyl.  Getting amoxicillin challenge per ID. Follow GI pathogen panel negative  Follow blood cultures pending [place] blood cultures 2/2 strep intermedius (will need to have them fax paperwork over again as I don't see in chart).  1/2 staph epi, suspect contaminant.   Follow echocardiogram -> no evidence valvular vegetation Plan for TEE 6/21 -> echo with EF 55-60%, RVSF normal, but severely elevated PASP - no left atrial/left atrial appendage thrombus, mildly dilted RA, normal MV, mild MV regurg.  Tricuspid AV.  AV regurgitation not visualized.  Moderate tricuspid valve regurgitation, significant TR, appears 2/2 leaflet prolapse, no clear vegetation seen. Elevated procal, ESR, CRP.  Leukocytosis.  Downtrending. ID c/s, appreciate recs Consider GI c/s S/p CT guided liver abscess drain on 6/19   Follow aspiration aerobic/anaerobic culture from 6/19 - abundant gram positive cocci  Addendum - clarified with pt, dental work (implant) in November - had several check ups since then, but sounds like extent was flossing and evaluation, not much more  CT from OSH with new multiple hypoattenuating masses in the liver, largest measuring up to 6.3 cm, concerning for metastataic disease or possibly intrahepatic abscesses given the patient's leukocytosis and elevated liver enzymes.  Focal nodular wall thickening in the sigmoid colon with mild adjacent stranding which could represent Brenda Morse colonic malignancy or short segment diverticulitis.  Recommend multiphase contrast enhanced MRI when the patient is stable and can tolerate Brenda Morse breath hold to further evaluate these liver masses.  Nondilated gallbladder with nonspecific wall thickening and non visible radioopaque stones.  Cholecystitis vs hepatocellular disease.  Small hiatal hernia.  Likely postinfectious/postinflammatory scarring and atelectasis in lower lungs, R>L.  Severe Pulmonary Artery Hypertension Noted on TEE, but not on the prior TTE? Unclear etiology, follow CT PE protocol AM BNP  Elevated Liver Enzymes Likely related to above - gradually improving Follow acute hepatitis studies (negative) and liver MRI (as above)  Concern for tick borne illness Doxy Appreciate ID recs  Acute Kidney Injury  Hyponatremia Improved after resuscitation/abx  Nausea  Vomiting  Diarrhea  Positive C diff antigen, negative toxin, positive PCR With positive c diff antigen and PCR, will treat with oral vanc Likely 2/2 above Treating for severe c diff, appreciate ID recs (500 qid vanc + flagyl) Will need prolonged course of vancomycin in setting of what is likely to be prolonged abx course Given focal nodular wall thickening in CT from OSH, consider discussion with GI regarding need for colonoscopy?  Acute Metabolic encephalopathy Appears essentially  completely resolved today Follow head CT - normal CT appearance of brain Normal ammonia, TSH, folate.  Elevated B12. EEG -> will cancel this given improvement in mental status Suspect this is 2/2 infection above.  Delirium precautions Follow MRI negative -- consider MRI with contrast, but given her significant improvement, will hold off at this time  Hypertension Holding atenolol and HCTZ for now  Hx Hashimoto's thyroiditis Normal TSH, not on meds  Hx Essential Tremor  Hx Chronic Intersitial Cystitis  Hx Gastric Ulcer  Gastritis  Holding singulair, zyrtec, afrin for now  DVT prophylaxis:lovenox Code Status: full  Family Communication: discussed with sister at bedside 6/20 Disposition:   Status is: Inpatient  Remains inpatient appropriate because:Inpatient level of care appropriate due to severity of illness  Dispo: The patient is from: Home              Anticipated d/c is to: Home              Patient currently is not medically stable to d/c.   Difficult to place patient No       Consultants:  ID  Procedures:  Echo IMPRESSIONS     1. Left ventricular ejection fraction, by estimation, is 60 to 65%. Left  ventricular ejection fraction by 3D volume is 62 %. The left ventricle has  normal function. The left ventricle has no regional wall motion  abnormalities. Left ventricular diastolic   parameters are consistent with Grade I diastolic dysfunction (impaired  relaxation).   2. Right ventricular systolic function  is normal. The right ventricular  size is normal.   3. The mitral valve is abnormal. trivial to mild mitral valve  regurgitation.   4. The aortic valve is tricuspid. Aortic valve regurgitation is not  visualized.   5. The inferior vena cava is normal in size with <50% respiratory  variability, suggesting right atrial pressure of 8 mmHg.   Comparison(s): No prior Echocardiogram.   Conclusion(s)/Recommendation(s): No evidence of valvular vegetations  on  this transthoracic echocardiogram. Would recommend Lyanna Blystone transesophageal  echocardiogram to exclude infective endocarditis if clinically indicated.   6/19 CT guided liver abscess drain          6/21 TEE IMPRESSIONS     1. Left ventricular ejection fraction, by estimation, is 55 to 60%. The  left ventricle has normal function.   2. Right ventricular systolic function is normal. The right ventricular  size is normal. There is severely elevated pulmonary artery systolic  pressure.   3. No left atrial/left atrial appendage thrombus was detected.   4. Right atrial size was mildly dilated.   5. The mitral valve is normal in structure. Mild mitral valve  regurgitation.   6. The aortic valve is tricuspid. Aortic valve regurgitation is not  visualized.   7. The tricuspid valve is abnormal. Tricuspid valve regurgitation is  moderate. Significant TR, appears to be due to leaflet prolapse, no clear  vegetation seen  Antimicrobials:  Anti-infectives (From admission, onward)    Start     Dose/Rate Route Frequency Ordered Stop   01/12/21 2015  metroNIDAZOLE (FLAGYL) IVPB 500 mg        500 mg 100 mL/hr over 60 Minutes Intravenous  Once 01/12/21 1920 01/12/21 2207   01/12/21 1115  amoxicillin (AMOXIL) capsule 500 mg        500 mg Oral  Once 01/12/21 1016 01/12/21 1118   01/11/21 1100  cefTRIAXone (ROCEPHIN) 2 g in sodium chloride 0.9 % 100 mL IVPB        2 g 200 mL/hr over 30 Minutes Intravenous Daily 01/11/21 0951     01/10/21 0000  ceFEPIme (MAXIPIME) 2 g in sodium chloride 0.9 % 100 mL IVPB  Status:  Discontinued        2 g 200 mL/hr over 30 Minutes Intravenous Every 24 hours 01/09/21 0037 01/09/21 1547   01/09/21 2200  ceFEPIme (MAXIPIME) 2 g in sodium chloride 0.9 % 100 mL IVPB  Status:  Discontinued        2 g 200 mL/hr over 30 Minutes Intravenous Every 24 hours 01/09/21 1628 01/11/21 0951   01/09/21 1800  vancomycin (VANCOCIN) capsule 500 mg        500 mg Oral 4 times daily  01/09/21 1631     01/09/21 1700  doxycycline (VIBRAMYCIN) 100 mg in sodium chloride 0.9 % 250 mL IVPB        100 mg 125 mL/hr over 120 Minutes Intravenous Every 12 hours 01/09/21 1547 01/18/21 2359   01/09/21 1700  metroNIDAZOLE (FLAGYL) IVPB 500 mg        500 mg 100 mL/hr over 60 Minutes Intravenous Every 8 hours 01/09/21 1609     01/09/21 1545  doxycycline (VIBRAMYCIN) 100 mg in sodium chloride 0.9 % 250 mL IVPB  Status:  Discontinued        100 mg 125 mL/hr over 120 Minutes Intravenous 2 times daily 01/09/21 1528 01/09/21 1535   01/09/21 1015  vancomycin (VANCOCIN) capsule 125 mg  Status:  Discontinued  125 mg Oral 4 times daily 01/09/21 0928 01/09/21 1631   01/09/21 0800  metroNIDAZOLE (FLAGYL) IVPB 500 mg  Status:  Discontinued        500 mg 100 mL/hr over 60 Minutes Intravenous Every 8 hours 01/09/21 0744 01/09/21 1547   01/09/21 0009  vancomycin variable dose per unstable renal function (pharmacist dosing)  Status:  Discontinued         Does not apply See admin instructions 01/09/21 0009 01/09/21 1147   01/08/21 2230  vancomycin (VANCOREADY) IVPB 1250 mg/250 mL        1,250 mg 166.7 mL/hr over 90 Minutes Intravenous  Once 01/08/21 2121 01/09/21 1741   01/08/21 2200  ceFEPIme (MAXIPIME) 2 g in sodium chloride 0.9 % 100 mL IVPB        2 g 200 mL/hr over 30 Minutes Intravenous NOW 01/08/21 2117 01/09/21 1605       Subjective: No new complaints  Objective: Vitals:   01/13/21 1406 01/13/21 1416 01/13/21 1524 01/13/21 1734  BP: 135/83 (!) 141/72 (!) 142/88 131/75  Pulse: 82 83 84 86  Resp: (!) 26 (!) 27 (!) 28 (!) 28  Temp:   97.7 F (36.5 C) 98.4 F (36.9 C)  TempSrc:   Oral Oral  SpO2: 99% 100% 94% 96%  Weight:      Height:        Intake/Output Summary (Last 24 hours) at 01/13/2021 2055 Last data filed at 01/13/2021 1600 Gross per 24 hour  Intake 2692.67 ml  Output 35 ml  Net 2657.67 ml   Filed Weights   01/08/21 2123 01/10/21 0400 01/11/21 0142  Weight:  81.6 kg 82.8 kg 83.8 kg    Examination:  General: No acute distress. Cardiovascular: Heart sounds show Brenda Morse regular rate, and rhythm. Lungs: Clear to auscultation bilaterally Abdomen: Soft, nontender, nondistended - drain with clear brown fluid  Neurological: Alert and oriented 3. Moves all extremities 4 . Cranial nerves II through XII grossly intact. Skin: Warm and dry. No rashes or lesions. Extremities: No clubbing or cyanosis. No edema.   Data Reviewed: I have personally reviewed following labs and imaging studies  CBC: Recent Labs  Lab 01/09/21 0548 01/10/21 0252 01/11/21 0914 01/12/21 0552 01/13/21 0539  WBC 18.3* 23.8* 21.7* 21.5* 18.2*  NEUTROABS  --   --  18.4* 17.4* 13.5*  HGB 11.6* 11.2* 10.6* 10.6* 10.5*  HCT 33.2* 32.7* 31.8* 32.4* 31.9*  MCV 82.4 81.5 84.8 86.2 85.3  PLT 153 197 217 221 859    Basic Metabolic Panel: Recent Labs  Lab 01/09/21 2128 01/10/21 0252 01/11/21 0914 01/12/21 0552 01/13/21 0539  NA 129* 132* 136 138 140  K 3.3* 3.0* 3.8 3.9 3.8  CL 98 101 111 111 114*  CO2 20* 21* 20* 20* 20*  GLUCOSE 174* 133* 98 95 106*  BUN 41* 38* _0 CREATININE 2.02* 1.72* 0.94 0.98 0.77  CALCIUM 7.4* 7.5* 7.5* 7.6* 7.7*  MG  --  2.3  --  1.9 1.7  PHOS 3.6  --   --  2.2* 2.9    GFR: Estimated Creatinine Clearance: 65.7 mL/min (by C-G formula based on SCr of 0.77 mg/dL).  Liver Function Tests: Recent Labs  Lab 01/09/21 0548 01/10/21 0252 01/11/21 0914 01/12/21 0552 01/13/21 0539  AST 795* 279* 124* 110* 44*  ALT 897* 642* 356* 273* 179*  ALKPHOS 118 139* 124 122 125  BILITOT 1.7* 1.0 0.4 0.5 0.5  PROT 5.8* 5.8* 4.9* 4.9* 4.7*  ALBUMIN  2.4* 2.3* 2.0* 1.9* 1.9*    CBG: Recent Labs  Lab 01/09/21 0231  GLUCAP 93     Recent Results (from the past 240 hour(s))  MRSA PCR Screening     Status: None   Collection Time: 01/09/21  4:08 PM  Result Value Ref Range Status   MRSA by PCR NEGATIVE NEGATIVE Final    Comment:        The  GeneXpert MRSA Assay (FDA approved for NASAL specimens only), is one component of Brenda Morse comprehensive MRSA colonization surveillance program. It is not intended to diagnose MRSA infection nor to guide or monitor treatment for MRSA infections. Performed at [facility], [place] [address], [place], [place] [address]   Gastrointestinal Panel by PCR , Stool     Status: None   Collection Time: 01/11/21  3:25 AM   Specimen: STOOL  Result Value Ref Range Status   Campylobacter species NOT DETECTED NOT DETECTED Final   Plesimonas shigelloides NOT DETECTED NOT DETECTED Final   Salmonella species NOT DETECTED NOT DETECTED Final   Yersinia enterocolitica NOT DETECTED NOT DETECTED Final   Vibrio species NOT DETECTED NOT DETECTED Final   Vibrio cholerae NOT DETECTED NOT DETECTED Final   Enteroaggregative E coli (EAEC) NOT DETECTED NOT DETECTED Final   Enteropathogenic E coli (EPEC) NOT DETECTED NOT DETECTED Final   Enterotoxigenic E coli (ETEC) NOT DETECTED NOT DETECTED Final   Shiga like toxin producing E coli (STEC) NOT DETECTED NOT DETECTED Final   Shigella/Enteroinvasive E coli (EIEC) NOT DETECTED NOT DETECTED Final   Cryptosporidium NOT DETECTED NOT DETECTED Final   Cyclospora cayetanensis NOT DETECTED NOT DETECTED Final   Entamoeba histolytica NOT DETECTED NOT DETECTED Final   Giardia lamblia NOT DETECTED NOT DETECTED Final   Adenovirus F40/41 NOT DETECTED NOT DETECTED Final   Astrovirus NOT DETECTED NOT DETECTED Final   Norovirus GI/GII NOT DETECTED NOT DETECTED Final   Rotavirus Brenda Morse NOT DETECTED NOT DETECTED Final   Sapovirus (I, II, IV, and V) NOT DETECTED NOT DETECTED Final    Comment: Performed at [facility], [place], [place] [address]  Aerobic/Anaerobic Culture w Gram Stain (surgical/deep wound)     Status: None (Preliminary result)   Collection Time: 01/11/21  2:14 PM   Specimen: Abscess  Result Value Ref Range Status   Specimen  Description   Final    ABSCESS Performed at [facility], [place] [address], [place], [place] [address]    Special Requests CT HEPATIC DRAIN  Final   Gram Stain   Final    ABUNDANT WBC PRESENT, PREDOMINANTLY PMN ABUNDANT GRAM POSITIVE COCCI Performed at [facility], [place] [address], [place], [place] [address]    Culture   Final    CULTURE REINCUBATED FOR BETTER GROWTH NO ANAEROBES ISOLATED; CULTURE IN PROGRESS FOR 5 DAYS    Report Status PENDING  Incomplete         Radiology Studies: EEG adult  Result Date: 2021-02-11 Lora Havens, MD     02-11-21  5:42 PM Patient Name: Brenda Morse MRN: [id number] Epilepsy Attending: Lora Havens Referring Physician/Provider: Dr Fayrene Helper Date: 11-Feb-2021 Duration: 22.30 mins Patient history: 70yo M with ams. EEG to evaluate for seizure Level of alertness: Awake, asleep AEDs during EEG study: None Technical aspects: This EEG study was done with scalp electrodes positioned according to the 10-20 International system of electrode placement. Electrical activity was acquired at Shannin Naab sampling rate of _0  and  reviewed with Brenda Morse high frequency filter of _0  and Brenda Morse low frequency filter of _1 . EEG data were recorded continuously and digitally stored. Description: The posterior dominant rhythm consists of 9 Hz activity of moderate voltage (25-35 uV) seen predominantly in posterior head regions, symmetric and reactive to eye opening and eye closing. Sleep was characterized by vertex waves, sleep spindles (12 to 14 Hz), maximal frontocentral region. Hyperventilation and photic stimulation were not performed.   IMPRESSION: This study is within normal limits. No seizures or epileptiform discharges were seen throughout the recording. Lora Havens   ECHO TEE  Result Date: 01/13/2021    TRANSESOPHOGEAL ECHO REPORT   Patient Name:   Brenda Morse Date of Exam: 01/13/2021 Medical Rec #:  [id number]     Height:       62.0 in Accession #:     [id number]    Weight:       184.7 lb Date of Birth:  1950/09/07     BSA:          1.848 m Patient Age:    56 years      BP:           151/80 mmHg Patient Gender: F             HR:           95 bpm. Exam Location:  Inpatient Procedure: 3D Echo, Transesophageal Echo, Cardiac Doppler and Color Doppler Indications:     Bacteremia  History:         Patient has prior history of Echocardiogram examinations, most                  recent 01/11/2021. Risk Factors:Hypertension and Dyslipidemia.  Sonographer:     Roseanna Rainbow RDCS Referring Phys:  [place] Diagnosing Phys: Oswaldo Milian MD PROCEDURE: After discussion of the risks and benefits of Harout Scheurich TEE, an informed consent was obtained from the patient. The transesophogeal probe was passed without difficulty through the esophogus of the patient. Imaged were obtained with the patient in Etola Mull left lateral decubitus position. Sedation performed by different physician. The patient was monitored while under deep sedation. Anesthestetic sedation was provided intravenously by Anesthesiology: 31m of Propofol. The patient developed no complications during the procedure. IMPRESSIONS  1. Left ventricular ejection fraction, by estimation, is 55 to 60%. The left ventricle has normal function.  2. Right ventricular systolic function is normal. The right ventricular size is normal. There is severely elevated pulmonary artery systolic pressure.  3. No left atrial/left atrial appendage thrombus was detected.  4. Right atrial size was mildly dilated.  5. The mitral valve is normal in structure. Mild mitral valve regurgitation.  6. The aortic valve is tricuspid. Aortic valve regurgitation is not visualized.  7. The tricuspid valve is abnormal. Tricuspid valve regurgitation is moderate. 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 The left ventricle has normal  function. The left ventricular internal cavity size was normal in size. Right Ventricle: The right ventricular size is normal. No increase in right ventricular wall thickness. Right ventricular systolic function is normal. There is severely elevated pulmonary artery systolic pressure. The tricuspid regurgitant velocity is 3.89 m/s, and with an assumed right atrial pressure of 8 mmHg, the estimated right ventricular systolic pressure is 642.6mmHg. Left Atrium: Left atrial size was normal in size. No left atrial/left atrial  appendage thrombus was detected. Right Atrium: Right atrial size was mildly dilated. Pericardium: There is no evidence of pericardial effusion. Mitral Valve: The mitral valve is normal in structure. Mild mitral valve regurgitation. Tricuspid Valve: The tricuspid valve is abnormal. Tricuspid valve regurgitation is moderate. Aortic Valve: The aortic valve is tricuspid. Aortic valve regurgitation is not visualized. Pulmonic Valve: The pulmonic valve was grossly normal. Pulmonic valve regurgitation is trivial. Aorta: The aortic root is normal in size and structure. IAS/Shunts: No atrial level shunt detected by color flow Doppler.  TRICUSPID VALVE TR Peak grad:   60.5 mmHg TR Vmax:        389.00 cm/s Oswaldo Milian MD Electronically signed by Oswaldo Milian MD Signature Date/Time: 01/13/2021/4:14:39 PM    Final         Scheduled Meds:  Chlorhexidine Gluconate Cloth  6 each Topical Daily   loratadine  10 mg Oral Daily   melatonin  3 mg Oral QHS   sodium chloride flush  5 mL Intracatheter Q8H   thiamine injection  100 mg Intravenous Daily   vancomycin  500 mg Oral QID   Continuous Infusions:  cefTRIAXone (ROCEPHIN)  IV 2 g (01/13/21 0850)   dextrose 5 % and 0.9 % NaCl with KCl 20 mEq/L 100 mL/hr at 01/13/21 1624   doxycycline (VIBRAMYCIN) IV 100 mg (01/13/21 1642)   metronidazole 500 mg (01/13/21 1852)     LOS: 5 days    Time spent: over 14 min    Fayrene Helper,  MD [facility]   To contact the attending provider between 7A-7P or the covering provider during after hours 7P-7A, please log into the web site [email or web address] and access using universal [place] password for that web site. If you do not have the password, please call the hospital operator.  01/13/2021, 8:55 PM

## 2021-01-13 NOTE — Progress Notes (Signed)
  Echocardiogram Echocardiogram Transesophageal has been performed.  Janalyn Harder 01/13/2021, 1:49 PM

## 2021-01-13 NOTE — Anesthesia Preprocedure Evaluation (Signed)
Anesthesia Evaluation  Patient identified by MRN, date of birth, ID band Patient awake    Reviewed: Allergy & Precautions, H&P , NPO status , Patient's Chart, lab work & pertinent test results, reviewed documented beta blocker date and time   Airway Mallampati: II  TM Distance: >3 FB Neck ROM: full    Dental no notable dental hx.    Pulmonary neg pulmonary ROS,    Pulmonary exam normal breath sounds clear to auscultation       Cardiovascular Exercise Tolerance: Good hypertension, negative cardio ROS   Rhythm:regular Rate:Normal  ECHO 6/22 1. Left ventricular ejection fraction, by estimation, is 60 to 65%. Left ventricular ejection fraction by 3D volume is 62 %. The left ventricle has normal function. The left ventricle has no regional wall motion abnormalities.Left ventricular diastolic parameters are consistent with Grade I diastolic dysfunction (impairedrelaxation). 2. Right ventricular systolic function is normal. The right ventricular sizeis normal. 3. The mitral valve is abnormal. trivial to mild mitral valve regurgitation. 4. The aortic valve is tricuspid. Aortic valve regurgitation is not visualized. 5. The inferior vena cava is normal in size with <50% respiratory variability,suggesting right atrial pressure of 8 mmHg.   Neuro/Psych negative neurological ROS  negative psych ROS   GI/Hepatic negative GI ROS, Neg liver ROS,   Endo/Other  negative endocrine ROS  Renal/GU negative Renal ROS  negative genitourinary   Musculoskeletal   Abdominal   Peds  Hematology negative hematology ROS (+)   Anesthesia Other Findings   Reproductive/Obstetrics negative OB ROS                             Anesthesia Physical Anesthesia Plan  ASA: 3  Anesthesia Plan: MAC   Post-op Pain Management:    Induction: Intravenous  PONV Risk Score and Plan: 2  Airway Management Planned: Mask,  Natural Airway and Simple Face Mask  Additional Equipment: None  Intra-op Plan:   Post-operative Plan:   Informed Consent: I have reviewed the patients History and Physical, chart, labs and discussed the procedure including the risks, benefits and alternatives for the proposed anesthesia with the patient or authorized representative who has indicated his/her understanding and acceptance.     Dental Advisory Given  Plan Discussed with: CRNA and Anesthesiologist  Anesthesia Plan Comments:         Anesthesia Quick Evaluation

## 2021-01-13 NOTE — Anesthesia Postprocedure Evaluation (Signed)
Anesthesia Post Note  Patient: Brenda Morse  Procedure(s) Performed: TRANSESOPHAGEAL ECHOCARDIOGRAM (TEE)     Patient location during evaluation: PACU Anesthesia Type: MAC Level of consciousness: awake and alert Pain management: pain level controlled Vital Signs Assessment: post-procedure vital signs reviewed and stable Respiratory status: spontaneous breathing, nonlabored ventilation, respiratory function stable and patient connected to nasal cannula oxygen Cardiovascular status: stable and blood pressure returned to baseline Postop Assessment: no apparent nausea or vomiting Anesthetic complications: no   No notable events documented.  Last Vitals:  Vitals:   01/13/21 1416 01/13/21 1524  BP: (!) 141/72 (!) 142/88  Pulse: 83 84  Resp: (!) 27 (!) 28  Temp:  36.5 C  SpO2: 100% 94%    Last Pain:  Vitals:   01/13/21 1524  TempSrc: Oral  PainSc: 0-No pain                 Effie Berkshire

## 2021-01-13 NOTE — CV Procedure (Signed)
     TRANSESOPHAGEAL ECHOCARDIOGRAM   NAME:  Marcene Laskowski   MRN: [id number] DOB:  19-Aug-1950   ADMIT DATE: 01/08/2021  INDICATIONS: Bacteremia  PROCEDURE:   Informed consent was obtained prior to the procedure. The risks, benefits and alternatives for the procedure were discussed and the patient comprehended these risks.  Risks include, but are not limited to, cough, sore throat, vomiting, nausea, somnolence, esophageal and stomach trauma or perforation, bleeding, low blood pressure, aspiration, pneumonia, infection, trauma to the teeth and death.    After a procedural time-out, the oropharynx was anesthetized and the patient was sedated by the anesthesia service. The transesophageal probe was inserted in the esophagus and stomach without difficulty and multiple views were obtained. Anesthesia was monitored by Janann Colonel, CRNA and Dr Hart Rochester.    COMPLICATIONS:    There were no immediate complications.  FINDINGS:  No vegetation seen.  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, [place] [address] [phone]   4:17 PM

## 2021-01-13 NOTE — Interval H&P Note (Signed)
History and Physical Interval Note:  01/13/2021 12:17 PM  Brenda Morse  has presented today for surgery, with the diagnosis of BACTERIMIA.  The various methods of treatment have been discussed with the patient and family. After consideration of risks, benefits and other options for treatment, the patient has consented to  Procedure(s): TRANSESOPHAGEAL ECHOCARDIOGRAM (TEE) (N/A) as a surgical intervention.  The patient's history has been reviewed, patient examined, no change in status, stable for surgery.  I have reviewed the patient's chart and labs.  Questions were answered to the patient's satisfaction.     Little Ishikawa

## 2021-01-13 NOTE — Anesthesia Procedure Notes (Signed)
Procedure Name: MAC Date/Time: 01/13/2021 12:57 PM Performed by: Kathryne Hitch, CRNA Pre-anesthesia Checklist: Patient identified, Emergency Drugs available, Suction available and Patient being monitored Patient Re-evaluated:Patient Re-evaluated prior to induction Oxygen Delivery Method: Nasal cannula Preoxygenation: Pre-oxygenation with 100% oxygen Induction Type: IV induction Placement Confirmation: positive ETCO2 Dental Injury: Teeth and Oropharynx as per pre-operative assessment

## 2021-01-13 NOTE — Progress Notes (Signed)
   01/13/21 1524  Vitals  Temp 97.7 F (36.5 C)  Temp Source Oral  BP (!) 142/88  MAP (mmHg) 103  BP Location Left Arm  BP Method Automatic  Patient Position (if appropriate) Lying  Pulse Rate 84  Pulse Rate Source Monitor  ECG Heart Rate 85  Resp (!) 28  Level of Consciousness  Level of Consciousness Alert  MEWS COLOR  MEWS Score Color Yellow  Oxygen Therapy  SpO2 94 %  O2 Device Room Air  Pain Assessment  Pain Scale 0-10  Pain Score 0  MEWS Score  MEWS Temp 0  MEWS Systolic 0  MEWS Pulse 0  MEWS RR 2  MEWS LOC 0  MEWS Score 2  Provider Notification  Provider Name/Title Dr.Powell  Date Provider Notified 01/13/21  Time Provider Notified 1533  Notification Type Page  Notification Reason Change in status (RR rate elevated)  Provider response En route (coming to assess patient)   Patient returned from [facility] via carelink. V/S taken, respiratory rate is elevated. MD paged, and is coming to check see her.

## 2021-01-14 ENCOUNTER — Inpatient Hospital Stay: Payer: Self-pay

## 2021-01-14 DIAGNOSIS — R7881 Bacteremia: Secondary | ICD-10-CM

## 2021-01-14 DIAGNOSIS — K750 Abscess of liver: Secondary | ICD-10-CM

## 2021-01-14 LAB — CBC WITH DIFFERENTIAL/PLATELET
Abs Immature Granulocytes: 1.31 10*3/uL — ABNORMAL HIGH (ref 0.00–0.07)
Basophils Absolute: 0.1 10*3/uL (ref 0.0–0.1)
Basophils Relative: 1 %
Eosinophils Absolute: 0.2 10*3/uL (ref 0.0–0.5)
Eosinophils Relative: 1 %
HCT: 31 % — ABNORMAL LOW (ref 36.0–46.0)
Hemoglobin: 10 g/dL — ABNORMAL LOW (ref 12.0–15.0)
Immature Granulocytes: 10 %
Lymphocytes Relative: 10 %
Lymphs Abs: 1.3 10*3/uL (ref 0.7–4.0)
MCH: 27.9 pg (ref 26.0–34.0)
MCHC: 32.3 g/dL (ref 30.0–36.0)
MCV: 86.6 fL (ref 80.0–100.0)
Monocytes Absolute: 0.6 10*3/uL (ref 0.1–1.0)
Monocytes Relative: 4 %
Neutro Abs: 10.3 10*3/uL — ABNORMAL HIGH (ref 1.7–7.7)
Neutrophils Relative %: 74 %
Platelets: 250 10*3/uL (ref 150–400)
RBC: 3.58 MIL/uL — ABNORMAL LOW (ref 3.87–5.11)
RDW: 15 % (ref 11.5–15.5)
WBC: 13.7 10*3/uL — ABNORMAL HIGH (ref 4.0–10.5)
nRBC: 0 % (ref 0.0–0.2)

## 2021-01-14 LAB — COMPREHENSIVE METABOLIC PANEL
ALT: 123 U/L — ABNORMAL HIGH (ref 0–44)
AST: 30 U/L (ref 15–41)
Albumin: 1.9 g/dL — ABNORMAL LOW (ref 3.5–5.0)
Alkaline Phosphatase: 149 U/L — ABNORMAL HIGH (ref 38–126)
Anion gap: 6 (ref 5–15)
BUN: 9 mg/dL (ref 8–23)
CO2: 21 mmol/L — ABNORMAL LOW (ref 22–32)
Calcium: 7.7 mg/dL — ABNORMAL LOW (ref 8.9–10.3)
Chloride: 112 mmol/L — ABNORMAL HIGH (ref 98–111)
Creatinine, Ser: 0.76 mg/dL (ref 0.44–1.00)
GFR, Estimated: >60 mL/min (ref >60)
Glucose, Bld: 110 mg/dL — ABNORMAL HIGH (ref 70–99)
Potassium: 3.8 mmol/L (ref 3.5–5.1)
Sodium: 139 mmol/L (ref 135–145)
Total Bilirubin: 0.8 mg/dL (ref 0.3–1.2)
Total Protein: 4.7 g/dL — ABNORMAL LOW (ref 6.5–8.1)

## 2021-01-14 LAB — CULTURE, BLOOD (ROUTINE X 2)
Culture: NO GROWTH
Culture: NO GROWTH
Special Requests: ADEQUATE
Special Requests: ADEQUATE

## 2021-01-14 LAB — MAGNESIUM: Magnesium: 1.6 mg/dL — ABNORMAL LOW (ref 1.7–2.4)

## 2021-01-14 LAB — PHOSPHORUS: Phosphorus: 3.5 mg/dL (ref 2.5–4.6)

## 2021-01-14 LAB — BRAIN NATRIURETIC PEPTIDE: B Natriuretic Peptide: 140 pg/mL — ABNORMAL HIGH (ref 0.0–100.0)

## 2021-01-14 MED ORDER — SODIUM CHLORIDE 0.9% FLUSH: 10.0000 mL | INTRAVENOUS | Status: DC | PRN

## 2021-01-14 MED ORDER — PENICILLIN G POTASSIUM 20000000 UNITS IJ SOLR
12.0000 10*6.[IU] | Freq: Two times a day (BID) | INTRAVENOUS | Status: DC
  Administered 2021-01-14 – 2021-01-15 (×3): 12 10*6.[IU] via INTRAVENOUS
  Filled 2021-01-14 (×4): qty 12

## 2021-01-14 MED ORDER — METRONIDAZOLE 500 MG PO TABS
500.0000 mg | ORAL_TABLET | Freq: Two times a day (BID) | ORAL | Status: DC
  Administered 2021-01-14 – 2021-01-15 (×3): 500 mg via ORAL
  Filled 2021-01-14 (×4): qty 1

## 2021-01-14 MED ORDER — MAGNESIUM SULFATE 2 GM/50ML IV SOLN
2.0000 g | Freq: Once | INTRAVENOUS | Status: AC
  Administered 2021-01-14: 2 g via INTRAVENOUS
  Filled 2021-01-14: qty 50

## 2021-01-14 MED ORDER — VANCOMYCIN HCL 125 MG PO CAPS
125.0000 mg | ORAL_CAPSULE | Freq: Four times a day (QID) | ORAL | Status: DC
  Administered 2021-01-14 – 2021-01-15 (×7): 125 mg via ORAL
  Filled 2021-01-14 (×8): qty 1

## 2021-01-14 MED ORDER — SODIUM CHLORIDE 0.9% FLUSH: 10.0000 mL | Freq: Two times a day (BID) | INTRAVENOUS | Status: DC

## 2021-01-14 NOTE — Progress Notes (Signed)
PROGRESS NOTE  Brenda Morse ZOX:[id number] DOB: October 23, 1950 DOA: 01/08/2021 PCP: Leane CallNodal, Jr Reinaldo, PA-C   LOS: 6 days   Brief Narrative / Interim history: 70 year old female with diverticular disease, Hashimoto thyroiditis, thrombocytopenia, essential tremor, chronic interstitial cystitis, history of gastric ulcer and gastritis, irritable bowel syndrome, B12 deficiency, who comes into the hospital with diarrhea, GI upset for the past 2 weeks, she was eventually diagnosed with a liver abscess as well as C. difficile.  [facility]  ID was consulted.  Subjective / 24h Interval events: She is doing better this morning, still has diarrhea but overall improving.  Assessment & Plan: Principal Problem Sepsis due to strep intermedius bacteremia, multiple hepatic abscesses -liver imaging was concerning for abscesses, MRI showed a dominant 7 cm complex cystic lesion in the medial liver lobe with multiple small liver lesions showing thin peripheral rim enhancement favoring abscesses.  ID consulted and following.  IR was also consulted and underwent drain placement.  Cultures preliminary with GPC's, final speciation pending.  She is currently on penicillin, metronidazole and she will need that for a total of 4 weeks.  She also underwent a TEE which showed no endocarditis but it did show severely elevated PASP.  Active Problems C. difficile diarrhea-she is on oral Vanco, will need 10-day course then twice daily for the duration of intravenous antibiotics.  Severe pulmonary arterial hypertension-no significant symptoms currently will need outpatient follow-up  Elevated LFTs-likely due to #1, gradually improving  AKI/hyponatremia-kidney function normalized with fluids  Acute metabolic encephalopathy-resolved  Essential hypertension-holding home medications, blood pressure acceptable  History of Hashimoto thyroiditis-normal TSH, not on any  meds  Scheduled Meds:  Chlorhexidine Gluconate Cloth  6 each Topical Daily   loratadine  10 mg Oral Daily   melatonin  3 mg Oral QHS   metroNIDAZOLE  500 mg Oral Q12H   sodium chloride flush  5 mL Intracatheter Q8H   thiamine injection  100 mg Intravenous Daily   vancomycin  125 mg Oral QID   Continuous Infusions:  doxycycline (VIBRAMYCIN) IV 100 mg (01/14/21 0536)   penicillin g continuous IV infusion     PRN Meds:.diphenhydrAMINE, EPINEPHrine, guaiFENesin-dextromethorphan, HYDROcodone-acetaminophen, morphine injection, ondansetron **OR** ondansetron (ZOFRAN) IV, sodium chloride  Diet Orders (From admission, onward)     Start     Ordered   01/13/21 1550  Diet regular Room service appropriate? Yes; Fluid consistency: Thin  Diet effective now       Question Answer Comment  Room service appropriate? Yes   Fluid consistency: Thin      01/13/21 1549            DVT prophylaxis: Place and maintain sequential compression device Start: 01/10/21 2006     Code Status: Full Code  Family Communication: No family at bedside  Status is: Inpatient  Remains inpatient appropriate because:IV treatments appropriate due to intensity of illness or inability to take PO  Dispo: The patient is from: Home              Anticipated d/c is to: Home              Patient currently is not medically stable to d/c.   Difficult to place patient No   Level of care: Telemetry  Consultants:  ID IR  Procedures:  CT-guided aspiration  Microbiology  As above  Antimicrobials: Penicillin, metronidazole, p.o. Vanco   Objective: Vitals:   01/13/21 2030 01/14/21 0017 01/14/21 0321 01/14/21 [id number]  BP: (!) 146/62 139/79 133/88 (!) 143/81  Pulse: 96 84 86 82  Resp: (!) 22 (!) 26  (!) 24  Temp: 98.1 F (36.7 C)  98.4 F (36.9 C) 98.5 F (36.9 C)  TempSrc: Oral  Oral Oral  SpO2: 99% 94% 95% 95%  Weight:      Height:        Intake/Output Summary (Last 24 hours) at 01/14/2021 1035 Last  data filed at 01/14/2021 0555 Gross per 24 hour  Intake 2827.67 ml  Output 10 ml  Net 2817.67 ml   Filed Weights   01/08/21 2123 01/10/21 0400 01/11/21 0142  Weight: 81.6 kg 82.8 kg 83.8 kg    Examination:  Constitutional: NAD Eyes: no scleral icterus ENMT: Mucous membranes are moist.  Neck: normal, supple Respiratory: clear to auscultation bilaterally, no wheezing, no crackles. Normal respiratory effort. No accessory muscle use.  Cardiovascular: Regular rate and rhythm, no murmurs / rubs / gallops. No LE edema.  Abdomen: non distended, no tenderness. Bowel sounds positive.  Musculoskeletal: no clubbing / cyanosis.  Skin: no rashes Neurologic: non focal   Data Reviewed: I have independently reviewed following labs and imaging studies   CBC: Recent Labs  Lab 01/10/21 0252 01/11/21 0914 01/12/21 0552 01/13/21 0539 01/14/21 0504  WBC 23.8* 21.7* 21.5* 18.2* 13.7*  NEUTROABS  --  18.4* 17.4* 13.5* 10.3*  HGB 11.2* 10.6* 10.6* 10.5* 10.0*  HCT 32.7* 31.8* 32.4* 31.9* 31.0*  MCV 81.5 84.8 86.2 85.3 86.6  PLT 197 217 221 245 250   Basic Metabolic Panel: Recent Labs  Lab 01/09/21 2128 01/10/21 0252 01/11/21 0914 01/12/21 0552 01/13/21 0539 01/14/21 0504  NA 129* 132* 136 138 140 139  K 3.3* 3.0* 3.8 3.9 3.8 3.8  CL 98 101 111 111 114* 112*  CO2 20* 21* 20* 20* 20* 21*  GLUCOSE 174* 133* 98 95 106* 110*  BUN 41* 38* 19 14 11 9   CREATININE 2.02* 1.72* 0.94 0.98 0.77 0.76  CALCIUM 7.4* 7.5* 7.5* 7.6* 7.7* 7.7*  MG  --  2.3  --  1.9 1.7 1.6*  PHOS 3.6  --   --  2.2* 2.9 3.5   Liver Function Tests: Recent Labs  Lab 01/10/21 0252 01/11/21 0914 01/12/21 0552 01/13/21 0539 01/14/21 0504  AST 279* 124* 110* 44* 30  ALT 642* 356* 273* 179* 123*  ALKPHOS 139* 124 122 125 149*  BILITOT 1.0 0.4 0.5 0.5 0.8  PROT 5.8* 4.9* 4.9* 4.7* 4.7*  ALBUMIN 2.3* 2.0* 1.9* 1.9* 1.9*   Coagulation Profile: Recent Labs  Lab 01/12/21 0552  INR 1.3*   HbA1C: No results for  input(s): HGBA1C in the last 72 hours. CBG: Recent Labs  Lab 01/09/21 0231  GLUCAP 93    Recent Results (from the past 240 hour(s))  Culture, blood (routine x 2)     Status: None   Collection Time: 01/09/21 10:05 AM   Specimen: BLOOD LEFT HAND  Result Value Ref Range Status   Specimen Description   Final    BLOOD LEFT HAND Performed at [facility], [address] [place], [place]    Special Requests   Final    BOTTLES DRAWN AEROBIC ONLY Blood Culture adequate volume Performed at [facility], [address] [place], [place]    Culture   Final    NO GROWTH 5 DAYS Performed at [facility], [address] [place], [place]    Report Status  01/14/2021 FINAL  Final  Culture, blood (routine x 2)     Status: None   Collection Time: 01/09/21 10:12 AM   Specimen: BLOOD  Result Value Ref Range Status   Specimen Description   Final    BLOOD RIGHT ANTECUBITAL Performed at [facility], [address] [place], [place] [address]    Special Requests   Final    BOTTLES DRAWN AEROBIC AND ANAEROBIC Blood Culture adequate volume Performed at [facility], [address] [place], [place] [address]    Culture   Final    NO GROWTH 5 DAYS Performed at [facility], [address], [place], [place] [address]    Report Status 01/14/2021 FINAL  Final  MRSA PCR Screening     Status: None   Collection Time: 01/09/21  4:08 PM  Result Value Ref Range Status   MRSA by PCR NEGATIVE NEGATIVE Final    Comment:        The GeneXpert MRSA Assay (FDA approved for NASAL specimens only), is one component of a comprehensive MRSA colonization surveillance program. It is not intended to diagnose MRSA infection nor to guide or monitor treatment for MRSA infections. Performed at [facility] [address] [place], [place] [address]   Gastrointestinal Panel by  PCR , Stool     Status: None   Collection Time: 01/11/21  3:25 AM   Specimen: STOOL  Result Value Ref Range Status   Campylobacter species NOT DETECTED NOT DETECTED Final   Plesimonas shigelloides NOT DETECTED NOT DETECTED Final   Salmonella species NOT DETECTED NOT DETECTED Final   Yersinia enterocolitica NOT DETECTED NOT DETECTED Final   Vibrio species NOT DETECTED NOT DETECTED Final   Vibrio cholerae NOT DETECTED NOT DETECTED Final   Enteroaggregative E coli (EAEC) NOT DETECTED NOT DETECTED Final   Enteropathogenic E coli (EPEC) NOT DETECTED NOT DETECTED Final   Enterotoxigenic E coli (ETEC) NOT DETECTED NOT DETECTED Final   Shiga like toxin producing E coli (STEC) NOT DETECTED NOT DETECTED Final   Shigella/Enteroinvasive E coli (EIEC) NOT DETECTED NOT DETECTED Final   Cryptosporidium NOT DETECTED NOT DETECTED Final   Cyclospora cayetanensis NOT DETECTED NOT DETECTED Final   Entamoeba histolytica NOT DETECTED NOT DETECTED Final   Giardia lamblia NOT DETECTED NOT DETECTED Final   Adenovirus F40/41 NOT DETECTED NOT DETECTED Final   Astrovirus NOT DETECTED NOT DETECTED Final   Norovirus GI/GII NOT DETECTED NOT DETECTED Final   Rotavirus A NOT DETECTED NOT DETECTED Final   Sapovirus (I, II, IV, and V) NOT DETECTED NOT DETECTED Final    Comment: Performed at [facility], [address] [place], [place] [address]  Aerobic/Anaerobic Culture w Gram Stain (surgical/deep wound)     Status: None (Preliminary result)   Collection Time: 01/11/21  2:14 PM   Specimen: Abscess  Result Value Ref Range Status   Specimen Description   Final    ABSCESS Performed at [facility], [address] [place], [place] [address]    Special Requests CT HEPATIC DRAIN  Final   Gram Stain   Final    ABUNDANT WBC PRESENT, PREDOMINANTLY PMN ABUNDANT GRAM POSITIVE COCCI Performed at [facility], [address] [place], [place] [address]    Culture   Final    CULTURE  REINCUBATED FOR BETTER GROWTH NO ANAEROBES ISOLATED; CULTURE IN PROGRESS FOR 5 DAYS    Report Status PENDING  Incomplete  Radiology Studies: CT Angio Chest Pulmonary Embolism (PE) W or WO Contrast  Result Date: 01/13/2021 CLINICAL DATA:  Chest pain and shortness of breath. EXAM: CT ANGIOGRAPHY CHEST WITH CONTRAST TECHNIQUE: Multidetector CT imaging of the chest was performed using the standard protocol during bolus administration of intravenous contrast. Multiplanar CT image reconstructions and MIPs were obtained to evaluate the vascular anatomy. CONTRAST:  80mL OMNIPAQUE IOHEXOL 350 MG/ML SOLN COMPARISON:  10/11/2017 FINDINGS: Cardiovascular: Satisfactory position of the pulmonary arteries to the segmental level. Respiratory motion artifact diminishes exam detail however. No suspicious filling defects identified to suggest acute pulmonary embolus. Normal heart size. No pericardial effusion. Aortic atherosclerosis and coronary artery calcifications. Mediastinum/Nodes: Normal appearance of the thyroid gland. The trachea appears patent and is midline. Normal appearance of the esophagus. Prominent mediastinal lymph nodes are identified. The largest is in the subcarinal region measuring 1.5 cm, image 19/5. No axillary or supraclavicular adenopathy. Lungs/Pleura: Small bilateral pleural effusions are identified right greater than left. Bilateral lower lung zone predominant increase interlobular septal thickening and ground-glass attenuation is noted concerning for pulmonary edema. Subsegmental atelectasis noted within both lower lung zones. No signs of airspace consolidation or pneumothorax. Calcified granuloma noted within the right middle lobe. Upper Abdomen: There is a percutaneous drainage catheter within segment scratch set percutaneous drainage catheter identified within the liver. The fluid collection is suboptimally evaluated due to lack of IV contrast material. Smaller lateral fluid collection within  right hepatic lobe measures 1.8 cm, image 111/4. Previously 1.9 cm. Musculoskeletal: No chest wall abnormality. No acute or significant osseous findings. Review of the MIP images confirms the above findings. IMPRESSION: 1. Exam detail is diminished secondary to respiratory motion artifact. No evidence for acute pulmonary embolus. 2. Small bilateral pleural effusions, right greater than left. Bilateral lower lung zone predominant interlobular septal thickening and ground-glass attenuation concerning for pulmonary edema. Correlate for any clinical signs or symptoms of congestive heart failure. 3. Prominent mediastinal lymph nodes are identified measuring up to 1.5 cm. In the setting of CHF this is a nonspecific finding. 4. Aortic atherosclerosis and coronary artery calcifications. 5. Status post percutaneous drainage catheter placement in the liver for abscess. Aortic Atherosclerosis (ICD10-I70.0). Electronically Signed   By: Signa Kell M.D.   On: 01/13/2021 22:29   ECHO TEE  Result Date: 01/13/2021    TRANSESOPHOGEAL ECHO REPORT   Patient Name:   Brenda Morse Date of Exam: 01/13/2021 Medical Rec #:  [id number]     Height:       62.0 in Accession #:    [id number]    Weight:       184.7 lb Date of Birth:  27-Sep-1950     BSA:          1.848 m Patient Age:    69 years      BP:           151/80 mmHg Patient Gender: F             HR:           95 bpm. Exam Location:  Inpatient Procedure: 3D Echo, Transesophageal Echo, Cardiac Doppler and Color Doppler Indications:     Bacteremia  History:         Patient has prior history of Echocardiogram examinations, most                  recent 01/11/2021. Risk Factors:Hypertension and Dyslipidemia.  Sonographer:     Sheralyn Boatman RDCS Referring Phys:  [phone] DAYNA N  DUNN Diagnosing Phys: Epifanio Lesches MD PROCEDURE: After discussion of the risks and benefits of a TEE, an informed consent was obtained from the patient. The transesophogeal probe was passed without difficulty  through the esophogus of the patient. Imaged were obtained with the patient in a left lateral decubitus position. Sedation performed by different physician. The patient was monitored while under deep sedation. Anesthestetic sedation was provided intravenously by Anesthesiology: 328mg  of Propofol. The patient developed no complications during the procedure. IMPRESSIONS  1. Left ventricular ejection fraction, by estimation, is 55 to 60%. The left ventricle has normal function.  2. Right ventricular systolic function is normal. The right ventricular size is normal. There is severely elevated pulmonary artery systolic pressure.  3. No left atrial/left atrial appendage thrombus was detected.  4. Right atrial size was mildly dilated.  5. The mitral valve is normal in structure. Mild mitral valve regurgitation.  6. The aortic valve is tricuspid. Aortic valve regurgitation is not visualized.  7. The tricuspid valve is abnormal. Tricuspid valve regurgitation is moderate. 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 The left ventricle has normal function. The left ventricular internal cavity size was normal in size. Right Ventricle: The right ventricular size is normal. No increase in right ventricular wall thickness. Right ventricular systolic function is normal. There is severely elevated pulmonary artery systolic pressure. The tricuspid regurgitant velocity is 3.89 m/s, and with an assumed right atrial pressure of 8 mmHg, the estimated right ventricular systolic pressure is 68.5 mmHg. Left Atrium: Left atrial size was normal in size. No left atrial/left atrial appendage thrombus was detected. Right Atrium: Right atrial size was mildly dilated. Pericardium: There is no evidence of pericardial effusion. Mitral Valve: The mitral valve is normal in structure. Mild mitral valve regurgitation. Tricuspid  Valve: The tricuspid valve is abnormal. Tricuspid valve regurgitation is moderate. Aortic Valve: The aortic valve is tricuspid. Aortic valve regurgitation is not visualized. Pulmonic Valve: The pulmonic valve was grossly normal. Pulmonic valve regurgitation is trivial. Aorta: The aortic root is normal in size and structure. IAS/Shunts: No atrial level shunt detected by color flow Doppler.  TRICUSPID VALVE TR Peak grad:   60.5 mmHg TR Vmax:        389.00 cm/s MD Electronically signed by Epifanio Lesches MD Signature Date/Time: 01/13/2021/4:14:39 PM    Final     01/15/2021, MD, PhD [facility]  Between 7 am - 7 pm I am available, please contact me via Amion (for emergencies) or Securechat (non urgent messages)  Between 7 pm - 7 am I am not available, please contact night coverage MD/APP via Amion

## 2021-01-14 NOTE — Progress Notes (Addendum)
[facility] Infectious Diseases Follow Up Note  Patient Identification: Patient Name: Brenda Morse MRN: [id number] Admit Date: 01/08/2021  7:53 PM Age: 70 y.o.Today's Date: 01/14/2021   Reason for Visit: Hepatic abscess/bacteremia  Principal Problem:   Liver mass Active Problems:   Allergic rhinitis   ETD (eustachian tube dysfunction)   Hyperlipidemia   Bacteremia   Antibiotics: Cefepime 6/16-6/18, Ceftriaxone 6/19-current                     doxycycline 6/17-current                    Metronidazole 6/17-current                    PO vancomycin 6/17-current   Lines/Tubes: External urethral catheter, PIV's    Interval Events: Continues to remain afebrile, leukocytosis is downtrending, CT chest with small bilateral pleural effusions   Assessment Streptococcus intermedius bacteremia Multiple hepatic abscess with no minute 7 cm complex cystic lesion in the medial liver dome -TTE is negative for vegetations -Status post CT-guided drain on 6/19.  Cultures with [facility]  C. difficile diarrhea, first episode: [place] of bowel movement is decreasing, less watery  Transaminitis: Improving History of penicillin allergy: Tolerated p.o. amoxicillin challenge, Penicillin Allergy removed from the chart Tick exposure  Recommendations Will change IV ceftriaxone to IV penicillin (Streptococcus intermedius is penicillin sensitive).  Plan to continue IV penicillin high-dose and metronidazole for 4 weeks Complete 10 day course of doxycycline Complete 10 days course of PO vancomycin followed by BID dosing for ppx while on IV antibiotics PICC Line  Follow-up CBC BMP every weekly Drain management per [facility]IR A follow-up with [facility] has been made for monitoring antibiotics/follow-up on hepatic abscess Will follow cultures peripherally till finalize. Otherwise, Will sign off for now  Rest of the management as per the primary team. Thank you for the  consult. Please page with pertinent questions or concerns.  ______________________________________________________________________ Subjective patient seen and examined at the bedside.  Sitting up in bed, had some nausea but no vomiting, denies abdominal pain.  Frequency of stool has decreased from 10-4 and it is less watery.  She continues to feel better  Vitals BP (!) 143/81   Pulse 82   Temp 98.5 F (36.9 C) (Oral)   Resp (!) 24   Ht 5\' 2"  (1.575 m)   Wt 83.8 kg   SpO2 95%   BMI 33.79 kg/m     Physical Exam Constitutional: Sitting up in bed, not in acute distress    Comments:   Cardiovascular:     Rate and Rhythm: Normal rate and regular rhythm.     Heart sounds:    Pulmonary:     Effort: Pulmonary effort is normal.     Comments:   Abdominal:     Palpations: Abdomen is soft.     Tenderness: Right upper quadrant drain, nontender and nondistended  Musculoskeletal:        General: No swelling or tenderness.   Skin:    Comments: No lesions or rashes  Neurological:     General: No focal deficit present.   Psychiatric:        Mood and Affect: Mood normal.    Pertinent Microbiology Results for orders placed or performed during the hospital encounter of 01/08/21  Culture, blood (routine x 2)     Status: None   Collection Time: 01/09/21 10:05 AM   Specimen: BLOOD LEFT HAND  Result Value Ref  Range Status   Specimen Description   Final    BLOOD LEFT HAND Performed at [facility], [address] [place], [place] [address]    Special Requests   Final    BOTTLES DRAWN AEROBIC ONLY Blood Culture adequate volume Performed at [facility], [address], [place], [place] [address]    Culture   Final    NO GROWTH 5 DAYS Performed at [facility], [address] [place], [place] [address]    Report Status 01/14/2021 FINAL  Final  Culture, blood (routine x 2)     Status: None   Collection Time: 01/09/21 10:12 AM    Specimen: BLOOD  Result Value Ref Range Status   Specimen Description   Final    BLOOD RIGHT ANTECUBITAL Performed at [facility], [address] [place], [place] [address]    Special Requests   Final    BOTTLES DRAWN AEROBIC AND ANAEROBIC Blood Culture adequate volume Performed at [facility], [address] [place], [place] [address]    Culture   Final    NO GROWTH 5 DAYS Performed at [facility], [address], [place], [place] [address]    Report Status 01/14/2021 FINAL  Final  MRSA PCR Screening     Status: None   Collection Time: 01/09/21  4:08 PM  Result Value Ref Range Status   MRSA by PCR NEGATIVE NEGATIVE Final    Comment:        The GeneXpert MRSA Assay (FDA approved for NASAL specimens only), is one component of a comprehensive MRSA colonization surveillance program. It is not intended to diagnose MRSA infection nor to guide or monitor treatment for MRSA infections. Performed at [facility], [address] [place], [place] [address]   Gastrointestinal Panel by PCR , Stool     Status: None   Collection Time: 01/11/21  3:25 AM   Specimen: STOOL  Result Value Ref Range Status   Campylobacter species NOT DETECTED NOT DETECTED Final   Plesimonas shigelloides NOT DETECTED NOT DETECTED Final   Salmonella species NOT DETECTED NOT DETECTED Final   Yersinia enterocolitica NOT DETECTED NOT DETECTED Final   Vibrio species NOT DETECTED NOT DETECTED Final   Vibrio cholerae NOT DETECTED NOT DETECTED Final   Enteroaggregative E coli (EAEC) NOT DETECTED NOT DETECTED Final   Enteropathogenic E coli (EPEC) NOT DETECTED NOT DETECTED Final   Enterotoxigenic E coli (ETEC) NOT DETECTED NOT DETECTED Final   Shiga like toxin producing E coli (STEC) NOT DETECTED NOT DETECTED Final   Shigella/Enteroinvasive E coli (EIEC) NOT DETECTED NOT DETECTED Final   Cryptosporidium NOT DETECTED NOT DETECTED Final   Cyclospora  cayetanensis NOT DETECTED NOT DETECTED Final   Entamoeba histolytica NOT DETECTED NOT DETECTED Final   Giardia lamblia NOT DETECTED NOT DETECTED Final   Adenovirus F40/41 NOT DETECTED NOT DETECTED Final   Astrovirus NOT DETECTED NOT DETECTED Final   Norovirus GI/GII NOT DETECTED NOT DETECTED Final   Rotavirus A NOT DETECTED NOT DETECTED Final   Sapovirus (I, II, IV, and V) NOT DETECTED NOT DETECTED Final    Comment: Performed at [facility], [address] [place], [place] [address]  Aerobic/Anaerobic Culture w Gram Stain (surgical/deep wound)     Status: None (Preliminary result)   Collection Time: 01/11/21  2:14 PM   Specimen: Abscess  Result Value Ref Range Status   Specimen Description   Final  ABSCESS Performed at [facility], [address], [place], [place] [address]    Special Requests CT HEPATIC DRAIN  Final   Gram Stain   Final    ABUNDANT WBC PRESENT, PREDOMINANTLY PMN ABUNDANT GRAM POSITIVE COCCI Performed at [facility], [address], [place], [place] [address]    Culture   Final    CULTURE REINCUBATED FOR BETTER GROWTH NO ANAEROBES ISOLATED; CULTURE IN PROGRESS FOR 5 DAYS    Report Status PENDING  Incomplete      Pertinent Lab. CBC Latest Ref Rng & Units 01/14/2021 01/13/2021 01/12/2021  WBC 4.0 - 10.5 K/uL 13.7(H) 18.2(H) 21.5(H)  Hemoglobin 12.0 - 15.0 g/dL 10.0(L) 10.5(L) 10.6(L)  Hematocrit 36.0 - 46.0 % 31.0(L) 31.9(L) 32.4(L)  Platelets 150 - 400 K/uL 250 245 221   CMP Latest Ref Rng & Units 01/14/2021 01/13/2021 01/12/2021  Glucose 70 - 99 mg/dL 353(G) 992(E) 95  BUN 8 - 23 mg/dL 9 11 14   Creatinine 0.44 - 1.00 mg/dL 2.68 3.41  Sodium 135 - 145 mmol/L 139 140 138  Potassium 3.5 - 5.1 mmol/L 3.8 3.8 3.9  Chloride 98 - 111 mmol/L 112(H) 114(H) 111  CO2 22 - 32 mmol/L 21(L) 20(L) 20(L)  Calcium 8.9 - 10.3 mg/dL 7.7(L) 7.7(L) 7.6(L)  Total Protein 6.5 - 8.1 g/dL 4.7(L) 4.7(L) 4.9(L)  Total Bilirubin 0.3 - 1.2  mg/dL 0.8 0.5 0.5  Alkaline Phos 38 - 126 U/L 149(H) 125 122  AST 15 - 41 U/L 30 44(H) 110(H)  ALT 0 - 44 U/L 123(H) 179(H) 273(H)     Pertinent Imaging today Plain films and CT images have been personally visualized and interpreted; radiology reports have been reviewed. Decision making incorporated into the Impression / Recommendations.  CTA chest 01/13/21 FINDINGS: Cardiovascular: Satisfactory position of the pulmonary arteries to the segmental level. Respiratory motion artifact diminishes exam detail however. No suspicious filling defects identified to suggest acute pulmonary embolus.   Normal heart size. No pericardial effusion. Aortic atherosclerosis and coronary artery calcifications.   Mediastinum/Nodes: Normal appearance of the thyroid gland. The trachea appears patent and is midline. Normal appearance of the esophagus. Prominent mediastinal lymph nodes are identified. The largest is in the subcarinal region measuring 1.5 cm, image 19/5. No axillary or supraclavicular adenopathy.   Lungs/Pleura: Small bilateral pleural effusions are identified right greater than left. Bilateral lower lung zone predominant increase interlobular septal thickening and ground-glass attenuation is noted concerning for pulmonary edema. Subsegmental atelectasis noted within both lower lung zones. No signs of airspace consolidation or pneumothorax. Calcified granuloma noted within the right middle lobe.   Upper Abdomen: There is a percutaneous drainage catheter within segment scratch set percutaneous drainage catheter identified within the liver. The fluid collection is suboptimally evaluated due to lack of IV contrast material. Smaller lateral fluid collection within right hepatic lobe measures 1.8 cm, image 111/4. Previously 1.9 cm.   Musculoskeletal: No chest wall abnormality. No acute or significant osseous findings.   Review of the MIP images confirms the above findings.    IMPRESSION: 1. Exam detail is diminished secondary to respiratory motion artifact. No evidence for acute pulmonary embolus. 2. Small bilateral pleural effusions, right greater than left. Bilateral lower lung zone predominant interlobular septal thickening and ground-glass attenuation concerning for pulmonary edema. Correlate for any clinical signs or symptoms of congestive heart failure. 3. Prominent mediastinal lymph nodes are identified measuring up to 1.5 cm. In the setting of CHF this is a nonspecific finding. 4.  Aortic atherosclerosis and coronary artery calcifications. 5. Status post percutaneous drainage catheter placement in the liver for abscess.   Aortic Atherosclerosis (ICD10-I70.0).     I have spent more than 35 minutes for this patient encounter including review of prior medical records, coordination of care  with greater than 50% of time being face to face/counseling and discussing diagnostics/treatment plan with the patient/family.  Electronically signed by:   Odette Fraction, MD Infectious Disease Physician [facility] Pager: [phone]

## 2021-01-14 NOTE — Progress Notes (Signed)
Diagnosis: Hepatic abscess  Culture result: Streptococcus Intermedius  No Active Allergies  OPAT Orders Discharge antibiotics to be given via PICC line Discharge antibiotics: Penicillin  Per pharmacy protocol  Duration: 4 weeks  End Date: 02/08/21  [facility] Care Per Protocol:  Home health RN for IV administration and teaching; PICC line care and labs.    Labs weekly while on IV antibiotics: _X_ CBC with differential _X_ BMP __ CMP __ CRP __ ESR __ Vancomycin trough __ CK  __ Please pull PIC at completion of IV antibiotics X_ Please leave PIC in place until doctor has seen patient or been notified  Fax weekly labs to [phone]  Clinic Follow Up Appt 02/04/21  '@2pm'    Rosiland Oz, MD Infectious Disease Physician [facility] [address] [place], [place] [address] Phone: [phone]  Fax: [phone]

## 2021-01-14 NOTE — Progress Notes (Signed)
Referring Physician(s): Dr. Lowell Guitar, C.   Supervising Physician: Simonne Come  Patient Status:  [facility] - In-pt  Chief Complaint:  Hepatic abscess, s/p drain placement with IR on 6/19   Subjective:  Pt sitting in bed, in no acute distress. Her friend and two Rns at the bedside.  States that she is feeling better today.   Allergies: Patient has no active allergies.  Medications: Prior to Admission medications   Medication Sig Start Date End Date Taking? Authorizing Provider  albuterol (VENTOLIN HFA) 108 (90 Base) MCG/ACT inhaler Inhale 1 puff into the lungs every 6 (six) hours as needed for wheezing or shortness of breath. 01/05/21  Yes [provider]  atenolol (TENORMIN) 25 MG tablet Take 25 mg by mouth daily. 07/10/20  Yes [provider]  cetirizine (ZYRTEC) 10 MG tablet Take 10 mg by mouth daily.   Yes [provider]  DM-APAP-CPM (CORICIDIN HBP) 10-325-2 MG TABS Take 1 tablet by mouth daily as needed (cough).   Yes [provider]  hydrochlorothiazide (HYDRODIURIL) 12.5 MG tablet Take 12.5 mg by mouth daily. 07/10/20  Yes [provider]  montelukast (SINGULAIR) 10 MG tablet Take 10 mg by mouth daily as needed (allergy). 07/10/20  Yes [provider]  Multiple Vitamin (MULTIVITAMIN ADULT PO) Take 1 tablet by mouth daily.   Yes [provider]  oxymetazoline (AFRIN) 0.05 % nasal spray Place 1 spray into both nostrils daily as needed for congestion.   Yes [provider]     Vital Signs: BP (!) 143/81   Pulse 82   Temp 98.5 F (36.9 C) (Oral)   Resp (!) 24   Ht  (1.575 m)   Wt 184 lb 11.9 oz (83.8 kg)   SpO2 95%   BMI 33.79 kg/m   Physical Exam Vitals reviewed.  Constitutional:      General: She is not in acute distress.    Appearance: Normal appearance.  HENT:     Head: Normocephalic and atraumatic.  Cardiovascular:     Rate and Rhythm: Normal rate.  Pulmonary:     Effort: Pulmonary  effort is normal.  Abdominal:     General: There is no distension.     Palpations: Abdomen is soft.  Skin:    General: Skin is warm and dry.     Coloration: Skin is not jaundiced.     Comments: Positive RUQ drain to a gravity bag. Site is unremarkable with no erythema, edema, tenderness, bleeding or drainage. Suture and stat lock in place. Dressing is clean, dry, and intact. Scant ml of  bile colored fluid noted in the gravity bag. Drain flushes well, does not aspirates much.    Neurological:     Mental Status: She is alert and oriented to person, place, and time.  Psychiatric:        Mood and Affect: Mood normal.        Behavior: Behavior normal.    Imaging: CT Angio Chest Pulmonary Embolism (PE) W or WO Contrast  Result Date: 01/13/2021 CLINICAL DATA:  Chest pain and shortness of breath. EXAM: CT ANGIOGRAPHY CHEST WITH CONTRAST TECHNIQUE: Multidetector CT imaging of the chest was performed using the standard protocol during bolus administration of intravenous contrast. Multiplanar CT image reconstructions and MIPs were obtained to evaluate the vascular anatomy. CONTRAST:  80mL OMNIPAQUE IOHEXOL 350 MG/ML SOLN COMPARISON:  10/11/2017 FINDINGS: Cardiovascular: Satisfactory position of the pulmonary arteries to the segmental level. Respiratory motion artifact diminishes exam detail however.  No suspicious filling defects identified to suggest acute pulmonary embolus. Normal heart size. No pericardial effusion. Aortic atherosclerosis and coronary artery calcifications. Mediastinum/Nodes: Normal appearance of the thyroid gland. The trachea appears patent and is midline. Normal appearance of the esophagus. Prominent mediastinal lymph nodes are identified. The largest is in the subcarinal region measuring 1.5 cm, image 19/5. No axillary or supraclavicular adenopathy. Lungs/Pleura: Small bilateral pleural effusions are identified right greater than left. Bilateral lower lung zone predominant increase  interlobular septal thickening and ground-glass attenuation is noted concerning for pulmonary edema. Subsegmental atelectasis noted within both lower lung zones. No signs of airspace consolidation or pneumothorax. Calcified granuloma noted within the right middle lobe. Upper Abdomen: There is a percutaneous drainage catheter within segment scratch set percutaneous drainage catheter identified within the liver. The fluid collection is suboptimally evaluated due to lack of IV contrast material. Smaller lateral fluid collection within right hepatic lobe measures 1.8 cm, image 111/4. Previously 1.9 cm. Musculoskeletal: No chest wall abnormality. No acute or significant osseous findings. Review of the MIP images confirms the above findings. IMPRESSION: 1. Exam detail is diminished secondary to respiratory motion artifact. No evidence for acute pulmonary embolus. 2. Small bilateral pleural effusions, right greater than left. Bilateral lower lung zone predominant interlobular septal thickening and ground-glass attenuation concerning for pulmonary edema. Correlate for any clinical signs or symptoms of congestive heart failure. 3. Prominent mediastinal lymph nodes are identified measuring up to 1.5 cm. In the setting of CHF this is a nonspecific finding. 4. Aortic atherosclerosis and coronary artery calcifications. 5. Status post percutaneous drainage catheter placement in the liver for abscess. Aortic Atherosclerosis (ICD10-I70.0). Electronically Signed   By: Signa Kell M.D.   On: 01/13/2021 22:29   MR BRAIN WO CONTRAST  Result Date: 01/10/2021 CLINICAL DATA:  Delirium. EXAM: MRI HEAD WITHOUT CONTRAST TECHNIQUE: Multiplanar, multiecho pulse sequences of the brain and surrounding structures were obtained without intravenous contrast. COMPARISON:  None. FINDINGS: Brain: No acute infarct, hemorrhage, or mass lesion is present. No significant white matter lesions are present. The ventricles are of normal size. No  significant extraaxial fluid collection is present. The internal auditory canals are within normal limits. The brainstem and cerebellum are within normal limits. Vascular: Flow is present in the major intracranial arteries. Skull and upper cervical spine: The craniocervical junction is normal. Upper cervical spine is within normal limits. Marrow signal is unremarkable. Sinuses/Orbits: The paranasal sinuses and mastoid air cells are clear. The globes and orbits are within normal limits. IMPRESSION: Negative MRI of the brain. Electronically Signed   By: Marin Roberts M.D.   On: 01/10/2021 14:35   MR LIVER WO CONRTAST  Result Date: 01/10/2021 CLINICAL DATA:  Indeterminate hepatic lesions on recent noncontrast CT. Nausea and vomiting. Diarrhea. EXAM: MRI ABDOMEN WITHOUT CONTRAST TECHNIQUE: Multiplanar multisequence MR imaging was performed without the administration of intravenous contrast. COMPARISON:  Noncontrast CT on 01/08/2021 from [facility] FINDINGS: Lower chest: No acute findings. Hepatobiliary: Multiple T2 hyperintense lesions are seen throughout the right and left lobes, largest in the central right hepatic lobe measuring 6.9 x 6.0 cm. These lesions cannot be completely characterized on this unenhanced exam, but are suspicious for metastatic disease with abscess is considered less likely. Gallbladder is unremarkable. No evidence of biliary ductal dilatation. Pancreas: No mass or inflammatory process visualized on this unenhanced exam. Spleen:  Within normal limits in size. Adrenals/Urinary tract: Unremarkable. No evidence of nephrolithiasis or hydronephrosis. Stomach/Bowel: Visualized portion unremarkable. Vascular/Lymphatic: No pathologically enlarged lymph nodes identified.  No evidence of abdominal aortic aneurysm. Other:  None. Musculoskeletal:  No suspicious bone lesions identified. IMPRESSION: Multiple lesions throughout the liver, largest in the central right lobe measuring 6.9 cm.  While these lesions cannot be completely characterized on this unenhanced exam, they are suspicious for metastatic disease with abscesses considered less likely. Consider abdomen MRI without and with contrast for further characterization. (Note that, unlike CT contrast, there is no shortage of gadolinium IV contrast for MRI). Electronically Signed   By: Danae Orleans M.D.   On: 01/10/2021 15:00   MR LIVER W WO CONTRAST  Result Date: 01/11/2021 CLINICAL DATA:  Indeterminate liver lesions. EXAM: MRI ABDOMEN WITHOUT AND WITH CONTRAST TECHNIQUE: Multiplanar multisequence MR imaging of the abdomen was performed both before and after the administration of intravenous contrast. CONTRAST:  89mL GADAVIST GADOBUTROL 1 MMOL/ML IV SOLN COMPARISON:  Noncontrast exams on 01/10/2021 and 01/08/2021 FINDINGS: Lower chest: Tiny right pleural effusion noted. Hepatobiliary: A complex cystic lesion is seen in the medial liver dome which shows numerous irregular enhancing internal septations and thin peripheral rim enhancement. This measures 6.8 x 6.3 cm on image 31/23. Multiple other smaller liver lesions are seen in both the right and left lobes which show thin peripheral rim enhancement. No solid liver masses are identified. These characteristics favor multiple hepatic abscesses over metastatic disease. Gallbladder is unremarkable. No evidence of biliary ductal dilatation. Pancreas:  No mass or inflammatory changes. Spleen:  Within normal limits in size and appearance. Adrenals/Urinary Tract: No masses identified. Tiny sub-cm cyst noted in upper pole of right kidney. No evidence of hydronephrosis. Stomach/Bowel: Visualized portion unremarkable. Vascular/Lymphatic: No pathologically enlarged lymph nodes identified. No acute vascular findings. Other: Minimal perihepatic ascites noted, as well as generalized retroperitoneal and body wall edema. Musculoskeletal:  No suspicious bone lesions identified. IMPRESSION: Dominant 7 cm complex  cystic lesion in the medial liver dome, with multiple smaller liver lesions showing thin peripheral rim enhancement. These characteristics favor multiple hepatic abscesses over metastatic disease. Minimal perihepatic ascites, as well as generalized retroperitoneal and body wall edema. Tiny right pleural effusion. Electronically Signed   By: Danae Orleans M.D.   On: 01/11/2021 10:17   EEG adult  Result Date: 01/12/2021 Charlsie Quest, MD     01/12/2021  5:42 PM Patient Name: Brenda Morse MRN: [id number] Epilepsy Attending: Charlsie Quest Referring Physician/Provider: Dr Lacretia Nicks Date: 01/12/2021 Duration: 22.30 mins Patient history: 70yo M with ams. EEG to evaluate for seizure Level of alertness: Awake, asleep AEDs during EEG study: None Technical aspects: This EEG study was done with scalp electrodes positioned according to the 10-20 International system of electrode placement. Electrical activity was acquired at a sampling rate of 500Hz  and reviewed with a high frequency filter of 70Hz  and a low frequency filter of 1Hz . EEG data were recorded continuously and digitally stored. Description: The posterior dominant rhythm consists of 9 Hz activity of moderate voltage (25-35 uV) seen predominantly in posterior head regions, symmetric and reactive to eye opening and eye closing. Sleep was characterized by vertex waves, sleep spindles (12 to 14 Hz), maximal frontocentral region. Hyperventilation and photic stimulation were not performed.   IMPRESSION: This study is within normal limits. No seizures or epileptiform discharges were seen throughout the recording.   ECHOCARDIOGRAM COMPLETE  Result Date: 01/11/2021    ECHOCARDIOGRAM REPORT   Patient Name:   Brenda Morse Date of Exam: 01/11/2021 Medical Rec #:  [id number]/13/[id number]     Height:  62.0 in Accession #:    [id number]    Weight:       184.7 lb Date of Birth:  17-Aug-1950     BSA:          1.848 m Patient Age:    69 years      BP:            124/64 mmHg Patient Gender: F             HR:           83 bpm. Exam Location:  Inpatient Procedure: 2D Echo, 3D Echo, Cardiac Doppler, Color Doppler and Strain Analysis Indications:    Bacteremia R78.81  History:        Patient has no prior history of Echocardiogram examinations.                 GERD, Hashimoto's thyroiditis, Thrombocytopenia, Acute                 vasculopathy sepsis, Strep Intermedius Bacteremia.  Sonographer:    Leta Jungling RDCS Referring Phys: [id number] [facility] IMPRESSIONS  1. Left ventricular ejection fraction, by estimation, is 60 to 65%. Left ventricular ejection fraction by 3D volume is 62 %. The left ventricle has normal function. The left ventricle has no regional wall motion abnormalities. Left ventricular diastolic  parameters are consistent with Grade I diastolic dysfunction (impaired relaxation).  2. Right ventricular systolic function is normal. The right ventricular size is normal.  3. The mitral valve is abnormal. trivial to mild mitral valve regurgitation.  4. The aortic valve is tricuspid. Aortic valve regurgitation is not visualized.  5. The inferior vena cava is normal in size with <50% respiratory variability, suggesting right atrial pressure of 8 mmHg. Comparison(s): No prior Echocardiogram. Conclusion(s)/Recommendation(s): No evidence of valvular vegetations on this transthoracic echocardiogram. Would recommend a transesophageal echocardiogram to exclude infective endocarditis if clinically indicated. FINDINGS  Left Ventricle: Left ventricular ejection fraction, by estimation, is 60 to 65%. Left ventricular ejection fraction by 3D volume is 62 %. The left ventricle has normal function. The left ventricle has no regional wall motion abnormalities. The left ventricular internal cavity size was normal in size. There is no left ventricular hypertrophy. Left ventricular diastolic parameters are consistent with Grade I diastolic dysfunction (impaired relaxation).  Indeterminate filling pressures. Right Ventricle: The right ventricular size is normal. No increase in right ventricular wall thickness. Right ventricular systolic function is normal. Left Atrium: Left atrial size was normal in size. Right Atrium: Right atrial size was normal in size. Pericardium: There is no evidence of pericardial effusion. Mitral Valve: The mitral valve is abnormal. There is mild thickening of the mitral valve leaflet(s). Trivial to mild mitral valve regurgitation, with posteriorly-directed jet. Tricuspid Valve: The tricuspid valve is grossly normal. Tricuspid valve regurgitation is trivial. Aortic Valve: The aortic valve is tricuspid. Aortic valve regurgitation is not visualized. Pulmonic Valve: The pulmonic valve was grossly normal. Pulmonic valve regurgitation is trivial. Aorta: The aortic root and ascending aorta are structurally normal, with no evidence of dilitation. Venous: The inferior vena cava is normal in size with less than 50% respiratory variability, suggesting right atrial pressure of 8 mmHg. IAS/Shunts: The interatrial septum was not well visualized.  LEFT VENTRICLE PLAX 2D LVIDd:         4.90 cm         Diastology LVIDs:         3.30 cm  LV e' medial:    6.19 cm/s LV PW:         0.80 cm         LV E/e' medial:  11.4 LV IVS:        0.90 cm         LV e' lateral:   9.13 cm/s LVOT diam:     2.00 cm         LV E/e' lateral: 7.7 LV SV:         60 LV SV Index:   33 LVOT Area:     3.14 cm        3D Volume EF                                LV 3D EF:    Left                                             ventricular                                             ejection                                             fraction by                                             3D volume                                             is 62 %.                                 3D Volume EF:                                3D EF:        62 % RIGHT VENTRICLE RV S prime:     12.00 cm/s TAPSE (M-mode): 1.7  cm LEFT ATRIUM             Index       RIGHT ATRIUM          Index LA diam:        3.30 cm 1.79 cm/m  RA Area:     8.17 cm LA Vol (A2C):   26.6 ml 14.39 ml/m RA Volume:   13.90 ml 7.52 ml/m LA Vol (A4C):   27.4 ml 14.82 ml/m LA Biplane Vol: 28.0 ml 15.15 ml/m  AORTIC VALVE LVOT Vmax:   88.60 cm/s LVOT Vmean:  65.100 cm/s LVOT VTI:    0.192 m  AORTA Ao Root diam: 3.00 cm Ao Asc diam:  2.90 cm MITRAL VALVE MV Area (  PHT): 5.27 cm    SHUNTS MV Decel Time: 144 msec    Systemic VTI:  0.19 m MV E velocity: 70.70 cm/s  Systemic Diam: 2.00 cm MV A velocity: 86.00 cm/s MV E/A ratio:  0.82 Zoila Shutter MD Electronically signed by Zoila Shutter MD Signature Date/Time: 01/11/2021/1:15:30 PM    Final    ECHO TEE  Result Date: 01/13/2021    TRANSESOPHOGEAL ECHO REPORT   Patient Name:   Brenda Morse Date of Exam: 01/13/2021 Medical Rec #:  [id number]     Height:       62.0 in Accession #:    [id number]    Weight:       184.7 lb Date of Birth:  1950/10/29     BSA:          1.848 m Patient Age:    69 years      BP:           151/80 mmHg Patient Gender: F             HR:           95 bpm. Exam Location:  Inpatient Procedure: 3D Echo, Transesophageal Echo, Cardiac Doppler and Color Doppler Indications:     Bacteremia  History:         Patient has prior history of Echocardiogram examinations, most                  recent 01/11/2021. Risk Factors:Hypertension and Dyslipidemia.  Sonographer:     Sheralyn Boatman RDCS Referring Phys:  [id number] Tacey Ruiz DUNN Diagnosing Phys: Epifanio Lesches MD PROCEDURE: After discussion of the risks and benefits of a TEE, an informed consent was obtained from the patient. The transesophogeal probe was passed without difficulty through the esophogus of the patient. Imaged were obtained with the patient in a left lateral decubitus position. Sedation performed by different physician. The patient was monitored while under deep sedation. Anesthestetic sedation was provided intravenously by Anesthesiology:   of Propofol. The patient developed no complications during the procedure. IMPRESSIONS  1. Left ventricular ejection fraction, by estimation, is 55 to 60%. The left ventricle has normal function.  2. Right ventricular systolic function is normal. The right ventricular size is normal. There is severely elevated pulmonary artery systolic pressure.  3. No left atrial/left atrial appendage thrombus was detected.  4. Right atrial size was mildly dilated.  5. The mitral valve is normal in structure. Mild mitral valve regurgitation.  6. The aortic valve is tricuspid. Aortic valve regurgitation is not visualized.  7. The tricuspid valve is abnormal. Tricuspid valve regurgitation is moderate. 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 The left ventricle has normal function. The left ventricular internal cavity size was normal in size. Right Ventricle: The right ventricular size is normal. No increase in right ventricular wall thickness. Right ventricular systolic function is normal. There is severely elevated pulmonary artery systolic pressure. The tricuspid regurgitant velocity is 3.89 m/s, and with an assumed right atrial pressure of 8 mmHg, the estimated right ventricular systolic pressure is 68.5 mmHg. Left Atrium: Left atrial size was normal in size. No left atrial/left atrial appendage thrombus was detected. Right Atrium: Right atrial size was mildly dilated. Pericardium: There is no evidence of pericardial effusion. Mitral Valve: The mitral valve is normal in structure. Mild mitral valve regurgitation. Tricuspid Valve: The tricuspid valve is abnormal.  Tricuspid valve regurgitation is moderate. Aortic Valve: The aortic valve is tricuspid. Aortic valve regurgitation is not visualized. Pulmonic Valve: The pulmonic valve was grossly normal. Pulmonic valve regurgitation is trivial. Aorta: The  aortic root is normal in size and structure. IAS/Shunts: No atrial level shunt detected by color flow Doppler.  TRICUSPID VALVE TR Peak grad:   60.5 mmHg TR Vmax:        389.00 cm/s Epifanio Lesches MD Electronically signed by Epifanio Lesches MD Signature Date/Time: 01/13/2021/4:14:39 PM    Final    CT IMAGE GUIDED FLUID DRAIN BY CATHETER  Result Date: 01/13/2021 CLINICAL DATA:  Multifocal hepatic abscess with a dominant 6.8 cm component EXAM: CT GUIDED DRAINAGE OF HEPATIC ABSCESS ANESTHESIA/SEDATION: Intravenous Fentanyl and Versed 2mg  were administered as conscious sedation during continuous monitoring of the patient's level of consciousness and physiological / cardiorespiratory status by the radiology RN, with a total moderate sedation time of 17 minutes. PROCEDURE: The procedure, risks, benefits, and alternatives were explained to the patient. Questions regarding the procedure were encouraged and answered. The patient understands and consents to the procedure. Select axial scans through the liver were obtained. The dominant collection was localized and an appropriate skin entry site was determined and marked. The operative field was prepped with chlorhexidinein a sterile fashion, and a sterile drape was applied covering the operative field. A sterile gown and sterile gloves were used for the procedure. Local anesthesia was provided with 1% Lidocaine. Under CT fluoroscopic guidance with gantry angulation, 18 gauge trocar needle advanced to the collection. Purulent material could be aspirated. Amplatz guidewire advanced easily within the collection, position confirmed on CT. Tract dilated to facilitate placement of a 12 French pigtail drain catheter, formed centrally within the collection. 30 mL purulent aspirate sent for Gram stain, culture and sensitivity. Catheter secured externally with 0 Prolene suture and StatLock and placed to gravity drain bag. The patient tolerated the procedure well.  COMPLICATIONS: None immediate FINDINGS: The dominant component in the right lobe was localized. 12 French pigtail drain catheter placed as above from a subcostal approach. A sample of the aspirate sent for Gram stain and culture. IMPRESSION: 1. Technically successful CT-guided hepatic abscess drain catheter placement Electronically Signed   By: M.D.   On: 01/13/2021 07:50    Labs:  CBC: Recent Labs    01/11/21 0914 01/12/21 0552 01/13/21 0539 01/14/21 0504  WBC 21.7* 21.5* 18.2* 13.7*  HGB 10.6* 10.6* 10.5* 10.0*  HCT 31.8* 32.4* 31.9* 31.0*  PLT 217 221 245 250    COAGS: Recent Labs    01/12/21 0552  INR 1.3*    BMP: Recent Labs    01/11/21 0914 01/12/21 0552 01/13/21 0539 01/14/21 0504  NA 136 138 140 139  K 3.8 3.9 3.8 3.8  CL 111 111 114* 112*  CO2 20* 20* 20* 21*  GLUCOSE 98 95 106* 110*  BUN 19 14 11 9   CALCIUM 7.5* 7.6* 7.7* 7.7*  CREATININE 0.94 0.98 0.77 0.76  GFRNONAA >60 >60 >60 >60    LIVER FUNCTION TESTS: Recent Labs    01/11/21 0914 01/12/21 0552 01/13/21 0539 01/14/21 0504  BILITOT 0.4 0.5 0.5 0.8  AST 124* 110* 44* 30  ALT 356* 273* 179* 123*  ALKPHOS 124 122 125 149*  PROT 4.9* 4.9* 4.7* 4.7*  ALBUMIN 2.0* 1.9* 1.9* 1.9*    Assessment and Plan:  70 yo female with sepsis and hepatic abscess s/p drain placement with IR on 6/19.   Pt  stable WBC  still elevated but trending down, hgb stable.  LFT improving.  VS intermittent tachypnea, afebrile.  Drain intact, flushes well, does not aspirates much. OP 10 cc, cx pending.   Discussed with Dr. Grace IsaacWatts, drain to be switched to a suction bulb and to be remained for 1-2 days at minimum.  Would consider f/u CT if OP remains less than 10 cc x 2 days and/or labs shows persistent leukocytosis.   The drain was switched from gravity bag to a suction bulb at the bedside.   Continue with flushing TID, output recording q shift and dressing changes as needed.   Further treatment plan per  TR[facility].  Appreciate and agree with the plan.  IR to follow.    Electronically Signed: Willette BraceAimee H Raylan Hanton, PA-C 01/14/2021, 9:24 AM   I spent a total of 15 Minutes at the the patient's bedside AND on the patient's hospital floor or unit, greater than 50% of which was counseling/coordinating care for hepatic abscess drain.

## 2021-01-14 NOTE — Progress Notes (Signed)
Peripherally Inserted Central Catheter Placement  The IV Nurse has discussed with the patient and/or persons authorized to consent for the patient, the purpose of this procedure and the potential benefits and risks involved with this procedure.  The benefits include less needle sticks, lab draws from the catheter, and the patient may be discharged home with the catheter. Risks include, but not limited to, infection, bleeding, blood clot (thrombus formation), and puncture of an artery; nerve damage and irregular heartbeat and possibility to perform a PICC exchange if needed/ordered by physician.  Alternatives to this procedure were also discussed.  Bard Power PICC patient education guide, fact sheet on infection prevention and patient information card has been provided to patient /or left at bedside.    PICC Placement Documentation  PICC Single Lumen 01/14/21 Right Basilic 35 cm 0 cm (Active)  Indication for Insertion or Continuance of Line Home intravenous therapies (PICC only) 01/14/21 1400  Exposed Catheter (cm) 0 cm 01/14/21 1400  Site Assessment Clean;Dry;Intact 01/14/21 1400  Line Status Saline locked;Flushed;Blood return noted 01/14/21 1400  Dressing Type Transparent;Securing device 01/14/21 1400  Dressing Status Clean;Dry;Intact 01/14/21 1400  Antimicrobial disc in place? Yes 01/14/21 1400  Safety Lock Not Applicable 01/14/21 1400  Line Care Connections checked and tightened 01/14/21 1400  Dressing Intervention New dressing 01/14/21 1400  Dressing Change Due 01/21/21 01/14/21 1400       Franne Grip Exline 01/14/2021, 2:26 PM

## 2021-01-14 NOTE — Progress Notes (Signed)
Allergy Note:  Assessment: Patient has a history of penicillin allergy when she had a tonsillectomy. She describes the reaction as breaking out in hives. She did not have any life threatening reactions and she has not tried to take penicillin antibiotics since as far as she remembers. Given the low risk history of her reaction we will challenge her with a dose of amoxicillin.   Plan:  -Amoxicillin 500mg  x1 -Remove allergy if tolerated  , PharmD, BCIDP Infectious Disease Pharmacist  Phone: [phone]   Outcome: Patient tolerated amoxicillin and her allergy history has been updated

## 2021-01-14 NOTE — TOC Progression Note (Addendum)
Transition of Care [facility]) - Progression Note    Patient Details  Name: Brenda Morse MRN: [id number] Date of Birth: 08/18/1950  [facility] CM/SW Contact  Golda Acre, RN Phone Number: 01/14/2021, 1:57 PM  Clinical Narrative:    Jeri Modena with [facility] aware of the patient .Marland Kitchen     Barriers to Discharge: Continued Medical Work up  Expected Discharge Plan and Services                                                 Social Determinants of Health (SDOH) Interventions    Readmission Risk Interventions No flowsheet data found.

## 2021-01-15 ENCOUNTER — Encounter (HOSPITAL_COMMUNITY): Payer: Self-pay | Admitting: Cardiology

## 2021-01-15 ENCOUNTER — Inpatient Hospital Stay (HOSPITAL_COMMUNITY): Payer: 59

## 2021-01-15 LAB — CREATININE, SERUM
Creatinine, Ser: 0.75 mg/dL (ref 0.44–1.00)
GFR, Estimated: >60 mL/min (ref >60)

## 2021-01-15 MED ORDER — HEPARIN SOD (PORK) LOCK FLUSH 100 UNIT/ML IV SOLN
250.0000 [IU] | INTRAVENOUS | Status: AC | PRN
  Administered 2021-01-15: 250 [IU]
  Filled 2021-01-15: qty 2.5

## 2021-01-15 MED ORDER — THIAMINE HCL 100 MG PO TABS
100.0000 mg | ORAL_TABLET | Freq: Every day | ORAL | Status: DC
  Administered 2021-01-15: 100 mg via ORAL
  Filled 2021-01-15: qty 1

## 2021-01-15 MED ORDER — DOXYCYCLINE HYCLATE 100 MG PO TABS
100.0000 mg | ORAL_TABLET | Freq: Two times a day (BID) | ORAL | Status: DC
  Administered 2021-01-15: 100 mg via ORAL
  Filled 2021-01-15: qty 1

## 2021-01-15 MED ORDER — IOHEXOL 300 MG/ML  SOLN
100.0000 mL | Freq: Once | INTRAMUSCULAR | Status: AC | PRN
  Administered 2021-01-15: 100 mL via INTRAVENOUS

## 2021-01-15 MED ORDER — METRONIDAZOLE 500 MG PO TABS: 500.0000 mg | ORAL_TABLET | Freq: Two times a day (BID) | ORAL | 0 refills | Status: DC

## 2021-01-15 MED ORDER — VANCOMYCIN HCL 125 MG PO CAPS: 125.0000 mg | ORAL_CAPSULE | Freq: Four times a day (QID) | ORAL | 0 refills | Status: AC

## 2021-01-15 MED ORDER — EPINEPHRINE 0.3 MG/0.3ML IJ SOAJ: 0.3000 mg | Freq: Once | INTRAMUSCULAR | 1 refills | Status: AC | PRN

## 2021-01-15 MED ORDER — VANCOMYCIN HCL 125 MG PO CAPS: 125.0000 mg | ORAL_CAPSULE | Freq: Two times a day (BID) | ORAL | 0 refills | Status: DC

## 2021-01-15 MED ORDER — DOXYCYCLINE HYCLATE 100 MG PO TABS: 100.0000 mg | ORAL_TABLET | Freq: Two times a day (BID) | ORAL | 0 refills | Status: AC

## 2021-01-15 MED ORDER — PENICILLIN G POTASSIUM IV (FOR PTA / DISCHARGE USE ONLY): 24.0000 10*6.[IU] | Freq: Every day | INTRAVENOUS | 0 refills | Status: AC

## 2021-01-15 NOTE — Discharge Summary (Signed)
Physician Discharge Summary  Lauralie Blacksher ZRA:[id number] DOB: 11-28-1950 DOA: 01/08/2021  PCP: Maggie Schwalbe, PA-C  Admit date: 01/08/2021 Discharge date: 01/15/2021  Admitted From: home Disposition:  home  Recommendations for Outpatient Follow-up:  Follow up with PCP in 1-2 weeks Follow up with IR next week Follow up with ID as scheduled  Home Health: RN Equipment/Devices: none  Discharge Condition: stable CODE STATUS: Full code Diet recommendation: regular  HPI: Per admitting MD, Brenda Morse is a 70 y.o. female with medical history significant of  significant of diverticular disease, GERD, Hashimoto's thyroiditis, thrombocytopenia, essential tremor, chronic interstitial cystitis, history of gastric ulcer and gastritis, irritable bowel syndrome, B12 deficiency, who was seen at [facility] with acute vasculopathy sepsis elevated for work-up.  Patient was seen by medical service.  Consultation.  She apparently has been having profuse diarrhea up to 40 times a day with nausea but no vomiting.  This been going on for over 2 weeks.  Denied any significant abdominal pain.  No urinary symptoms.  Patient had colonoscopy about 2 and half months ago by Dr. Lyndel Safe Nothing was seen at the time except for diverticular disease.  But she came in today with fever and chills.  COVID-19 was negative.  Patient was evaluated and found to have alert several centimeter mass in the liver with another small liver mass.  Also elevated LFTs concerning for abscess versus malignancy.  Patient had a white count of 23,000.  Temperature 1.3.  She also had urinalysis that is nondiagnostic.  Lactic acid was 1.3 magnesium 2.1.  Tylenol level was negative.  Her AST was 30-55 ALT 1481 total bilirubin 2.3.  She is also in acute kidney injury with creatinine 2.10 glucose 163 BUN 27.  Her hemoglobin was found to be 10.9.  Based on her findings patient is suspected to have liver abscess.  She was therefore transferred  here for further evaluation and treatment.  She has underlying history of depression as well but appears stable here.  She reported no diarrhea since arrival here at [facility].  Also no nausea or vomiting at this point.  Patient is being admitted with expected GI follow-up.Marland Kitchen  Hospital Course / Discharge diagnoses: Principal Problem Sepsis due to strep intermedius bacteremia, multiple hepatic abscesses -liver imaging was concerning for abscesses, MRI showed a dominant 7 cm complex cystic lesion in the medial liver lobe with multiple small liver lesions showing thin peripheral rim enhancement favoring abscesses.  ID consulted and following.  IR was also consulted and underwent drain placement. Abscess cultures confirms strep intermedius.  She is currently on penicillin, metronidazole and she will need that for a total of 4 weeks.  She also underwent a TEE which showed no endocarditis but it did show severely elevated PASP. She is also to complete 10 days of Doxycycline with 3 more days remaining on discharge   Active Problems C. difficile diarrhea-she is on oral Vanco, will need 10-day course then twice daily for the duration of intravenous antibiotics. Severe pulmonary arterial hypertension-no significant symptoms currently will need outpatient follow-up Elevated LFTs-likely due to #1, gradually improving AKI/hyponatremia-kidney function normalized with fluids Acute metabolic encephalopathy-resolved Essential hypertension-holding home medications, blood pressure acceptable History of Hashimoto thyroiditis-normal TSH, not on any meds    Discharge Instructions  Discharge Instructions     [facility] pharmacist to adjust dose for Vancomycin, Aminoglycosides and other anti-infective therapies as requested by physician.   Complete by: As directed    [facility] to  provide Cath Flo 52m   Complete by: As directed    Administer for PICC line occlusion and as ordered by  physician for other access device issues.   Anaphylaxis Kit: Provided to treat any anaphylactic reaction to the medication being provided to the patient if First Dose or when requested by physician   Complete by: As directed    Epinephrine 169mml vial / amp: Administer 0.22m82m0.22ml57mubcutaneously once for moderate to severe anaphylaxis, nurse to call physician and pharmacy when reaction occurs and call 911 if needed for immediate care   Diphenhydramine 50mg22mIV vial: Administer 25-50mg 40mM PRN for first dose reaction, rash, itching, mild reaction, nurse to call physician and pharmacy when reaction occurs   Sodium Chloride 0.9% NS 500ml I39mdminister if needed for hypovolemic blood pressure drop or as ordered by physician after call to physician with anaphylactic reaction   Change dressing on IV access line weekly and PRN   Complete by: As directed    Flush IV access with Sodium Chloride 0.9% and Heparin 10 units/ml or 100 units/ml   Complete by: As directed    Home infusion instructions - [facility]   Complete by: As directed    Instructions: Flush IV access with Sodium Chloride 0.9% and Heparin 10units/ml or 100units/ml   Change dressing on IV access line: Weekly and PRN   Instructions Cath Flo 2mg: Ad76mister for PICC Line occlusion and as ordered by physician for other access device   [facility] pharmacist to adjust dose for: Vancomycin, Aminoglycosides and other anti-infective therapies as requested by physician   Method of administration may be changed at the discretion of home infusion pharmacist based upon assessment of the patient and/or caregiver's ability to self-administer the medication ordered   Complete by: As directed       Allergies as of 01/15/2021   No Active Allergies      Medication List     TAKE these medications    albuterol 108 (90 Base) MCG/ACT inhaler Commonly known as: VENTOLIN HFA Inhale 1 puff into the lungs every 6 (six) hours  as needed for wheezing or shortness of breath.   atenolol 25 MG tablet Commonly known as: TENORMIN Take 25 mg by mouth daily.   cetirizine 10 MG tablet Commonly known as: ZYRTEC Take 10 mg by mouth daily.   Coricidin HBP 10-325-2 MG Tabs Generic drug: DM-APAP-CPM Take 1 tablet by mouth daily as needed (cough).   doxycycline 100 MG tablet Commonly known as: VIBRA-TABS Take 1 tablet (100 mg total) by mouth every 12 (twelve) hours for 3 days.   EPINEPHrine 0.3 mg/0.3 mL Soaj injection Commonly known as: EPI-PEN Inject 0.3 mg into the muscle once as needed (if patient exhibits significant signs and symptoms of allergic reaction.).   hydrochlorothiazide 12.5 MG tablet Commonly known as: HYDRODIURIL Take 12.5 mg by mouth daily.   metroNIDAZOLE 500 MG tablet Commonly known as: Flagyl Take 1 tablet (500 mg total) by mouth 2 (two) times daily for 24 days.   montelukast 10 MG tablet Commonly known as: SINGULAIR Take 10 mg by mouth daily as needed (allergy).   MULTIVITAMIN ADULT PO Take 1 tablet by mouth daily.   oxymetazoline 0.05 % nasal spray Commonly known as: AFRIN Place 1 spray into both nostrils daily as needed for congestion.   penicillin G  IVPB Inject 24 Million Units into the vein daily for 24 days. As a continuous infusion. Indication:  Liver abscess First Dose: No Last  Day of Therapy:  02/08/2021 Labs - Once weekly:  CBC/D and BMP, Labs - Every other week:  ESR and CRP Method of administration: Elastomeric (Continuous infusion) Method of administration may be changed at the discretion of home infusion pharmacist based upon assessment of the patient and/or caregiver's ability to self-administer the medication ordered.   vancomycin 125 MG capsule Commonly known as: VANCOCIN Take 1 capsule (125 mg total) by mouth 4 (four) times daily for 3 days.   vancomycin 125 MG capsule Commonly known as: VANCOCIN Take 1 capsule (125 mg total) by mouth in the morning and at  bedtime for 24 days. Start taking on: January 19, 2021               Discharge Care Instructions  (From admission, onward)           Start     Ordered   01/15/21 0000  Change dressing on IV access line weekly and PRN  (Home infusion instructions - [facility] )        01/15/21 1023            Consultations: IR ID  Procedures/Studies:  CT HEAD WO CONTRAST  Result Date: 01/09/2021 CLINICAL DATA:  Delirium.  Confusion. EXAM: CT HEAD WITHOUT CONTRAST TECHNIQUE: Contiguous axial images were obtained from the base of the skull through the vertex without intravenous contrast. COMPARISON:  None. FINDINGS: Brain: No acute infarct, hemorrhage, or mass lesion is present. The ventricles are of normal size. No significant extraaxial fluid collection is present. The craniocervical junction is normal. Upper cervical spine is within normal limits. Marrow signal is unremarkable. Vascular: Atherosclerotic calcifications are present within the cavernous internal carotid arteries bilaterally. No hyperdense vessel is present. Skull: Calvarium is intact. No focal lytic or blastic lesions are present. No significant extracranial soft tissue lesion is present. Sinuses/Orbits: The paranasal sinuses and mastoid air cells are clear. The globes and orbits are within normal limits. IMPRESSION: 1. Normal CT appearance of the brain. 2. Atherosclerosis. Electronically Signed   By: San Morelle M.D.   On: 01/09/2021 15:12   CT Angio Chest Pulmonary Embolism (PE) W or WO Contrast  Result Date: 01/13/2021 CLINICAL DATA:  Chest pain and shortness of breath. EXAM: CT ANGIOGRAPHY CHEST WITH CONTRAST TECHNIQUE: Multidetector CT imaging of the chest was performed using the standard protocol during bolus administration of intravenous contrast. Multiplanar CT image reconstructions and MIPs were obtained to evaluate the vascular anatomy. CONTRAST:  29m OMNIPAQUE IOHEXOL 350 MG/ML SOLN COMPARISON:   10/11/2017 FINDINGS: Cardiovascular: Satisfactory position of the pulmonary arteries to the segmental level. Respiratory motion artifact diminishes exam detail however. No suspicious filling defects identified to suggest acute pulmonary embolus. Normal heart size. No pericardial effusion. Aortic atherosclerosis and coronary artery calcifications. Mediastinum/Nodes: Normal appearance of the thyroid gland. The trachea appears patent and is midline. Normal appearance of the esophagus. Prominent mediastinal lymph nodes are identified. The largest is in the subcarinal region measuring 1.5 cm, image 19/5. No axillary or supraclavicular adenopathy. Lungs/Pleura: Small bilateral pleural effusions are identified right greater than left. Bilateral lower lung zone predominant increase interlobular septal thickening and ground-glass attenuation is noted concerning for pulmonary edema. Subsegmental atelectasis noted within both lower lung zones. No signs of airspace consolidation or pneumothorax. Calcified granuloma noted within the right middle lobe. Upper Abdomen: There is a percutaneous drainage catheter within segment scratch set percutaneous drainage catheter identified within the liver. The fluid collection is suboptimally evaluated due to lack of IV contrast  material. Smaller lateral fluid collection within right hepatic lobe measures 1.8 cm, image 111/4. Previously 1.9 cm. Musculoskeletal: No chest wall abnormality. No acute or significant osseous findings. Review of the MIP images confirms the above findings. IMPRESSION: 1. Exam detail is diminished secondary to respiratory motion artifact. No evidence for acute pulmonary embolus. 2. Small bilateral pleural effusions, right greater than left. Bilateral lower lung zone predominant interlobular septal thickening and ground-glass attenuation concerning for pulmonary edema. Correlate for any clinical signs or symptoms of congestive heart failure. 3. Prominent mediastinal  lymph nodes are identified measuring up to 1.5 cm. In the setting of CHF this is a nonspecific finding. 4. Aortic atherosclerosis and coronary artery calcifications. 5. Status post percutaneous drainage catheter placement in the liver for abscess. Aortic Atherosclerosis (ICD10-I70.0). Electronically Signed   By: Kerby Moors M.D.   On: 01/13/2021 22:29   MR BRAIN WO CONTRAST  Result Date: 01/10/2021 CLINICAL DATA:  Delirium. EXAM: MRI HEAD WITHOUT CONTRAST TECHNIQUE: Multiplanar, multiecho pulse sequences of the brain and surrounding structures were obtained without intravenous contrast. COMPARISON:  None. FINDINGS: Brain: No acute infarct, hemorrhage, or mass lesion is present. No significant white matter lesions are present. The ventricles are of normal size. No significant extraaxial fluid collection is present. The internal auditory canals are within normal limits. The brainstem and cerebellum are within normal limits. Vascular: Flow is present in the major intracranial arteries. Skull and upper cervical spine: The craniocervical junction is normal. Upper cervical spine is within normal limits. Marrow signal is unremarkable. Sinuses/Orbits: The paranasal sinuses and mastoid air cells are clear. The globes and orbits are within normal limits. IMPRESSION: Negative MRI of the brain. Electronically Signed   By: San Morelle M.D.   On: 01/10/2021 14:35   MR LIVER WO CONRTAST  Result Date: 01/10/2021 CLINICAL DATA:  Indeterminate hepatic lesions on recent noncontrast CT. Nausea and vomiting. Diarrhea. EXAM: MRI ABDOMEN WITHOUT CONTRAST TECHNIQUE: Multiplanar multisequence MR imaging was performed without the administration of intravenous contrast. COMPARISON:  Noncontrast CT on 01/08/2021 from [place]: Lower chest: No acute findings. Hepatobiliary: Multiple T2 hyperintense lesions are seen throughout the right and left lobes, largest in the central right hepatic lobe measuring 6.9  x 6.0 cm. These lesions cannot be completely characterized on this unenhanced exam, but are suspicious for metastatic disease with abscess is considered less likely. Gallbladder is unremarkable. No evidence of biliary ductal dilatation. Pancreas: No mass or inflammatory process visualized on this unenhanced exam. Spleen:  Within normal limits in size. Adrenals/Urinary tract: Unremarkable. No evidence of nephrolithiasis or hydronephrosis. Stomach/Bowel: Visualized portion unremarkable. Vascular/Lymphatic: No pathologically enlarged lymph nodes identified. No evidence of abdominal aortic aneurysm. Other:  None. Musculoskeletal:  No suspicious bone lesions identified. IMPRESSION: Multiple lesions throughout the liver, largest in the central right lobe measuring 6.9 cm. While these lesions cannot be completely characterized on this unenhanced exam, they are suspicious for metastatic disease with abscesses considered less likely. Consider abdomen MRI without and with contrast for further characterization. (Note that, unlike CT contrast, there is no shortage of gadolinium IV contrast for MRI). Electronically Signed   By: Marlaine Hind M.D.   On: 01/10/2021 15:00   MR LIVER W WO CONTRAST  Result Date: 01/11/2021 CLINICAL DATA:  Indeterminate liver lesions. EXAM: MRI ABDOMEN WITHOUT AND WITH CONTRAST TECHNIQUE: Multiplanar multisequence MR imaging of the abdomen was performed both before and after the administration of intravenous contrast. CONTRAST:  43m GADAVIST GADOBUTROL 1 MMOL/ML IV SOLN COMPARISON:  Noncontrast  exams on 01/10/2021 and 01/08/2021 FINDINGS: Lower chest: Tiny right pleural effusion noted. Hepatobiliary: A complex cystic lesion is seen in the medial liver dome which shows numerous irregular enhancing internal septations and thin peripheral rim enhancement. This measures 6.8 x 6.3 cm on image 31/23. Multiple other smaller liver lesions are seen in both the right and left lobes which show thin peripheral  rim enhancement. No solid liver masses are identified. These characteristics favor multiple hepatic abscesses over metastatic disease. Gallbladder is unremarkable. No evidence of biliary ductal dilatation. Pancreas:  No mass or inflammatory changes. Spleen:  Within normal limits in size and appearance. Adrenals/Urinary Tract: No masses identified. Tiny sub-cm cyst noted in upper pole of right kidney. No evidence of hydronephrosis. Stomach/Bowel: Visualized portion unremarkable. Vascular/Lymphatic: No pathologically enlarged lymph nodes identified. No acute vascular findings. Other: Minimal perihepatic ascites noted, as well as generalized retroperitoneal and body wall edema. Musculoskeletal:  No suspicious bone lesions identified. IMPRESSION: Dominant 7 cm complex cystic lesion in the medial liver dome, with multiple smaller liver lesions showing thin peripheral rim enhancement. These characteristics favor multiple hepatic abscesses over metastatic disease. Minimal perihepatic ascites, as well as generalized retroperitoneal and body wall edema. Tiny right pleural effusion. Electronically Signed   By: Marlaine Hind M.D.   On: 01/11/2021 10:17   EEG adult  Result Date: 01/12/2021 Lora Havens, MD     01/12/2021  5:42 PM Patient Name: Bryony Kaman MRN: [id number] Epilepsy Attending: Lora Havens Referring Physician/Provider: Dr Fayrene Helper Date: 01/12/2021 Duration: 22.30 mins Patient history: 70yo M with ams. EEG to evaluate for seizure Level of alertness: Awake, asleep AEDs during EEG study: None Technical aspects: This EEG study was done with scalp electrodes positioned according to the 10-20 International system of electrode placement. Electrical activity was acquired at a sampling rate of '500Hz'  and reviewed with a high frequency filter of '70Hz'  and a low frequency filter of '1Hz' . EEG data were recorded continuously and digitally stored. Description: The posterior dominant rhythm consists of 9 Hz  activity of moderate voltage (25-35 uV) seen predominantly in posterior head regions, symmetric and reactive to eye opening and eye closing. Sleep was characterized by vertex waves, sleep spindles (12 to 14 Hz), maximal frontocentral region. Hyperventilation and photic stimulation were not performed.   IMPRESSION: This study is within normal limits. No seizures or epileptiform discharges were seen throughout the recording. Lora Havens   ECHOCARDIOGRAM COMPLETE  Result Date: 01/11/2021    ECHOCARDIOGRAM REPORT   Patient Name:   JET TRAYNHAM Date of Exam: 01/11/2021 Medical Rec #:  [id number]     Height:       62.0 in Accession #:    [id number]    Weight:       184.7 lb Date of Birth:  1951/05/01     BSA:          1.848 m Patient Age:    70 years      BP:           124/64 mmHg Patient Gender: F             HR:           83 bpm. Exam Location:  Inpatient Procedure: 2D Echo, 3D Echo, Cardiac Doppler, Color Doppler and Strain Analysis Indications:    Bacteremia R78.81  History:        Patient has no prior history of Echocardiogram examinations.  GERD, Hashimoto's thyroiditis, Thrombocytopenia, Acute                 vasculopathy sepsis, Strep Intermedius Bacteremia.  Sonographer:    Darlina Sicilian RDCS Referring Phys: [id number] [place]  1. Left ventricular ejection fraction, by estimation, is 60 to 65%. Left ventricular ejection fraction by 3D volume is 62 %. The left ventricle has normal function. The left ventricle has no regional wall motion abnormalities. Left ventricular diastolic  parameters are consistent with Grade I diastolic dysfunction (impaired relaxation).  2. Right ventricular systolic function is normal. The right ventricular size is normal.  3. The mitral valve is abnormal. trivial to mild mitral valve regurgitation.  4. The aortic valve is tricuspid. Aortic valve regurgitation is not visualized.  5. The inferior vena cava is normal in size with <50% respiratory  variability, suggesting right atrial pressure of 8 mmHg. Comparison(s): No prior Echocardiogram. Conclusion(s)/Recommendation(s): No evidence of valvular vegetations on this transthoracic echocardiogram. Would recommend a transesophageal echocardiogram to exclude infective endocarditis if clinically indicated. FINDINGS  Left Ventricle: Left ventricular ejection fraction, by estimation, is 60 to 65%. Left ventricular ejection fraction by 3D volume is 62 %. The left ventricle has normal function. The left ventricle has no regional wall motion abnormalities. The left ventricular internal cavity size was normal in size. There is no left ventricular hypertrophy. Left ventricular diastolic parameters are consistent with Grade I diastolic dysfunction (impaired relaxation). Indeterminate filling pressures. Right Ventricle: The right ventricular size is normal. No increase in right ventricular wall thickness. Right ventricular systolic function is normal. Left Atrium: Left atrial size was normal in size. Right Atrium: Right atrial size was normal in size. Pericardium: There is no evidence of pericardial effusion. Mitral Valve: The mitral valve is abnormal. There is mild thickening of the mitral valve leaflet(s). Trivial to mild mitral valve regurgitation, with posteriorly-directed jet. Tricuspid Valve: The tricuspid valve is grossly normal. Tricuspid valve regurgitation is trivial. Aortic Valve: The aortic valve is tricuspid. Aortic valve regurgitation is not visualized. Pulmonic Valve: The pulmonic valve was grossly normal. Pulmonic valve regurgitation is trivial. Aorta: The aortic root and ascending aorta are structurally normal, with no evidence of dilitation. Venous: The inferior vena cava is normal in size with less than 50% respiratory variability, suggesting right atrial pressure of 8 mmHg. IAS/Shunts: The interatrial septum was not well visualized.  LEFT VENTRICLE PLAX 2D LVIDd:         4.90 cm         Diastology  LVIDs:         3.30 cm         LV e' medial:    6.19 cm/s LV PW:         0.80 cm         LV E/e' medial:  11.4 LV IVS:        0.90 cm         LV e' lateral:   9.13 cm/s LVOT diam:     2.00 cm         LV E/e' lateral: 7.7 LV SV:         60 LV SV Index:   33 LVOT Area:     3.14 cm        3D Volume EF                                LV 3D EF:  Left                                             ventricular                                             ejection                                             fraction by                                             3D volume                                             is 62 %.                                 3D Volume EF:                                3D EF:        62 % RIGHT VENTRICLE RV S prime:     12.00 cm/s TAPSE (M-mode): 1.7 cm LEFT ATRIUM             Index       RIGHT ATRIUM          Index LA diam:        3.30 cm 1.79 cm/m  RA Area:     8.17 cm LA Vol (A2C):   26.6 ml 14.39 ml/m RA Volume:   13.90 ml 7.52 ml/m LA Vol (A4C):   27.4 ml 14.82 ml/m LA Biplane Vol: 28.0 ml 15.15 ml/m  AORTIC VALVE LVOT Vmax:   88.60 cm/s LVOT Vmean:  65.100 cm/s LVOT VTI:    0.192 m  AORTA Ao Root diam: 3.00 cm Ao Asc diam:  2.90 cm MITRAL VALVE MV Area (PHT): 5.27 cm    SHUNTS MV Decel Time: 144 msec    Systemic VTI:  0.19 m MV E velocity: 70.70 cm/s  Systemic Diam: 2.00 cm MV A velocity: 86.00 cm/s MV E/A ratio:  0.82 Lyman Bishop MD Electronically signed by Lyman Bishop MD Signature Date/Time: 01/11/2021/1:15:30 PM    Final    ECHO TEE  Result Date: 01/13/2021    TRANSESOPHOGEAL ECHO REPORT   Patient Name:   HERLINDA HEADY Date of Exam: 01/13/2021 Medical Rec #:  [id number]     Height:       62.0 in Accession #:    [id number]    Weight:       184.7 lb Date of Birth:  04-10-1951     BSA:          1.848 m Patient Age:    34 years  BP:           151/80 mmHg Patient Gender: F             HR:           95 bpm. Exam Location:  Inpatient Procedure: 3D Echo, Transesophageal Echo,  Cardiac Doppler and Color Doppler Indications:     Bacteremia  History:         Patient has prior history of Echocardiogram examinations, most                  recent 01/11/2021. Risk Factors:Hypertension and Dyslipidemia.  Sonographer:     Roseanna Rainbow RDCS Referring Phys:  [place] Diagnosing Phys: Oswaldo Milian MD PROCEDURE: After discussion of the risks and benefits of a TEE, an informed consent was obtained from the patient. The transesophogeal probe was passed without difficulty through the esophogus of the patient. Imaged were obtained with the patient in a left lateral decubitus position. Sedation performed by different physician. The patient was monitored while under deep sedation. Anesthestetic sedation was provided intravenously by Anesthesiology: 39m of Propofol. The patient developed no complications during the procedure. IMPRESSIONS  1. Left ventricular ejection fraction, by estimation, is 55 to 60%. The left ventricle has normal function.  2. Right ventricular systolic function is normal. The right ventricular size is normal. There is severely elevated pulmonary artery systolic pressure.  3. No left atrial/left atrial appendage thrombus was detected.  4. Right atrial size was mildly dilated.  5. The mitral valve is normal in structure. Mild mitral valve regurgitation.  6. The aortic valve is tricuspid. Aortic valve regurgitation is not visualized.  7. The tricuspid valve is abnormal. Tricuspid valve regurgitation is moderate. 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 The left ventricle has normal function. The left ventricular internal cavity size was normal in size. Right Ventricle: The right ventricular size is normal. No increase in right ventricular wall thickness. Right ventricular systolic function is normal. There is severely elevated pulmonary artery  systolic pressure. The tricuspid regurgitant velocity is 3.89 m/s, and with an assumed right atrial pressure of 8 mmHg, the estimated right ventricular systolic pressure is 675.6mmHg. Left Atrium: Left atrial size was normal in size. No left atrial/left atrial appendage thrombus was detected. Right Atrium: Right atrial size was mildly dilated. Pericardium: There is no evidence of pericardial effusion. Mitral Valve: The mitral valve is normal in structure. Mild mitral valve regurgitation. Tricuspid Valve: The tricuspid valve is abnormal. Tricuspid valve regurgitation is moderate. Aortic Valve: The aortic valve is tricuspid. Aortic valve regurgitation is not visualized. Pulmonic Valve: The pulmonic valve was grossly normal. Pulmonic valve regurgitation is trivial. Aorta: The aortic root is normal in size and structure. IAS/Shunts: No atrial level shunt detected by color flow Doppler.  TRICUSPID VALVE TR Peak grad:   60.5 mmHg TR Vmax:        389.00 cm/s COswaldo MilianMD Electronically signed by COswaldo MilianMD Signature Date/Time: 01/13/2021/4:14:39 PM    Final    CT IMAGE GUIDED FLUID DRAIN BY CATHETER  Result Date: 01/13/2021 CLINICAL DATA:  Multifocal hepatic abscess with a dominant 6.8 cm component EXAM: CT GUIDED DRAINAGE OF HEPATIC ABSCESS ANESTHESIA/SEDATION: Intravenous Fentanyl 1047m and Versed 60m64mere administered as conscious sedation during continuous monitoring of the patient's level of consciousness and physiological / cardiorespiratory status by the radiology RN,  with a total moderate sedation time of 17 minutes. PROCEDURE: The procedure, risks, benefits, and alternatives were explained to the patient. Questions regarding the procedure were encouraged and answered. The patient understands and consents to the procedure. Select axial scans through the liver were obtained. The dominant collection was localized and an appropriate skin entry site was determined and marked. The operative  field was prepped with chlorhexidinein a sterile fashion, and a sterile drape was applied covering the operative field. A sterile gown and sterile gloves were used for the procedure. Local anesthesia was provided with 1% Lidocaine. Under CT fluoroscopic guidance with gantry angulation, 18 gauge trocar needle advanced to the collection. Purulent material could be aspirated. Amplatz guidewire advanced easily within the collection, position confirmed on CT. Tract dilated to facilitate placement of a 12 French pigtail drain catheter, formed centrally within the collection. 30 mL purulent aspirate sent for Gram stain, culture and sensitivity. Catheter secured externally with 0 Prolene suture and StatLock and placed to gravity drain bag. The patient tolerated the procedure well. COMPLICATIONS: None immediate FINDINGS: The dominant component in the right lobe was localized. 12 French pigtail drain catheter placed as above from a subcostal approach. A sample of the aspirate sent for Gram stain and culture. IMPRESSION: 1. Technically successful CT-guided hepatic abscess drain catheter placement Electronically Signed   By: Lucrezia Europe M.D.   On: 01/13/2021 07:50   [place] EKG SITE RITE  Result Date: 01/14/2021 If [facility] image not attached, placement could not be confirmed due to current cardiac rhythm.    Subjective: - no chest pain, shortness of breath, no abdominal pain, nausea or vomiting.   Discharge Exam: BP 133/76 (BP Location: Left Arm)   Pulse 86   Temp 98.6 F (37 C) (Oral)   Resp 20   Ht '5\' 2"'  (1.575 m)   Wt 83.8 kg   SpO2 97%   BMI 33.79 kg/m   General: Pt is alert, awake, not in acute distress Cardiovascular: RRR, S1/S2 +, no rubs, no gallops Respiratory: CTA bilaterally, no wheezing, no rhonchi Abdominal: Soft, NT, ND, bowel sounds + Extremities: no edema, no cyanosis    The results of significant diagnostics from this hospitalization (including imaging, microbiology, ancillary and  laboratory) are listed below for reference.     Microbiology: Recent Results (from the past 240 hour(s))  Culture, blood (routine x 2)     Status: None   Collection Time: 01/09/21 10:05 AM   Specimen: BLOOD LEFT HAND  Result Value Ref Range Status   Specimen Description   Final    BLOOD LEFT HAND Performed at [facility] [address], [place], [place] [address]    Special Requests   Final    BOTTLES DRAWN AEROBIC ONLY Blood Culture adequate volume Performed at [facility] [address], [place], [place] [address]    Culture   Final    NO GROWTH 5 DAYS Performed at [facility], [place] [address] [place], [place] [address]    Report Status 01/14/2021 FINAL  Final  Culture, blood (routine x 2)     Status: None   Collection Time: 01/09/21 10:12 AM   Specimen: BLOOD  Result Value Ref Range Status   Specimen Description   Final    BLOOD RIGHT ANTECUBITAL Performed at [facility] [address] [place], [place] [address]    Special Requests   Final    BOTTLES DRAWN AEROBIC AND ANAEROBIC Blood Culture  adequate volume Performed at [facility] [address] [place], [place] [address]    Culture   Final    NO GROWTH 5 DAYS Performed at [facility], [place] [address] [place], [place] [address]    Report Status 01/14/2021 FINAL  Final  MRSA PCR Screening     Status: None   Collection Time: 01/09/21  4:08 PM  Result Value Ref Range Status   MRSA by PCR NEGATIVE NEGATIVE Final    Comment:        The GeneXpert MRSA Assay (FDA approved for NASAL specimens only), is one component of a comprehensive MRSA colonization surveillance program. It is not intended to diagnose MRSA infection nor to guide or monitor treatment for MRSA infections. Performed at [facility], [place] [address], [place], [place] [address]   Gastrointestinal Panel by PCR , Stool     Status: None    Collection Time: 01/11/21  3:25 AM   Specimen: STOOL  Result Value Ref Range Status   Campylobacter species NOT DETECTED NOT DETECTED Final   Plesimonas shigelloides NOT DETECTED NOT DETECTED Final   Salmonella species NOT DETECTED NOT DETECTED Final   Yersinia enterocolitica NOT DETECTED NOT DETECTED Final   Vibrio species NOT DETECTED NOT DETECTED Final   Vibrio cholerae NOT DETECTED NOT DETECTED Final   Enteroaggregative E coli (EAEC) NOT DETECTED NOT DETECTED Final   Enteropathogenic E coli (EPEC) NOT DETECTED NOT DETECTED Final   Enterotoxigenic E coli (ETEC) NOT DETECTED NOT DETECTED Final   Shiga like toxin producing E coli (STEC) NOT DETECTED NOT DETECTED Final   Shigella/Enteroinvasive E coli (EIEC) NOT DETECTED NOT DETECTED Final   Cryptosporidium NOT DETECTED NOT DETECTED Final   Cyclospora cayetanensis NOT DETECTED NOT DETECTED Final   Entamoeba histolytica NOT DETECTED NOT DETECTED Final   Giardia lamblia NOT DETECTED NOT DETECTED Final   Adenovirus F40/41 NOT DETECTED NOT DETECTED Final   Astrovirus NOT DETECTED NOT DETECTED Final   Norovirus GI/GII NOT DETECTED NOT DETECTED Final   Rotavirus A NOT DETECTED NOT DETECTED Final   Sapovirus (I, II, IV, and V) NOT DETECTED NOT DETECTED Final    Comment: Performed at [facility], [place], [place] [address]  Aerobic/Anaerobic Culture w Gram Stain (surgical/deep wound)     Status: None (Preliminary result)   Collection Time: 01/11/21  2:14 PM   Specimen: Abscess  Result Value Ref Range Status   Specimen Description   Final    ABSCESS Performed at [facility], [place] [address], [place], [place] [address]    Special Requests CT HEPATIC DRAIN  Final   Gram Stain   Final    ABUNDANT WBC PRESENT, PREDOMINANTLY PMN ABUNDANT GRAM POSITIVE COCCI Performed at [facility], [place] [address], [place], [place] [address]    Culture   Final    FEW STREPTOCOCCUS INTERMEDIUS NO  ANAEROBES ISOLATED; CULTURE IN PROGRESS FOR 5 DAYS    Report Status PENDING  Incomplete   Organism ID, Bacteria STREPTOCOCCUS INTERMEDIUS  Final      Susceptibility   Streptococcus intermedius - MIC*    PENICILLIN <=0.06 SENSITIVE Sensitive     CEFTRIAXONE 0.25 SENSITIVE Sensitive     ERYTHROMYCIN <=0.12 SENSITIVE Sensitive     LEVOFLOXACIN 0.5 SENSITIVE Sensitive     VANCOMYCIN 0.5 SENSITIVE Sensitive     * FEW STREPTOCOCCUS INTERMEDIUS     Labs: Basic Metabolic Panel: Recent Labs  Lab 01/09/21  2128 01/10/21 0252 01/11/21 0914 01/12/21 0552 01/13/21 0539 01/14/21 0504 01/15/21 0500  NA 129* 132* 136 138 140 139  --   K 3.3* 3.0* 3.8 3.9 3.8 3.8  --   CL 98 101 111 111 114* 112*  --   CO2 20* 21* 20* 20* 20* 21*  --   GLUCOSE 174* 133* 98 95 106* 110*  --   BUN 41* 38* '19 14 11 9  ' --   CREATININE 2.02* 1.72* 0.94 0.98 0.77 0.76 0.75  CALCIUM 7.4* 7.5* 7.5* 7.6* 7.7* 7.7*  --   MG  --  2.3  --  1.9 1.7 1.6*  --   PHOS 3.6  --   --  2.2* 2.9 3.5  --    Liver Function Tests: Recent Labs  Lab 01/10/21 0252 01/11/21 0914 01/12/21 0552 01/13/21 0539 01/14/21 0504  AST 279* 124* 110* 44* 30  ALT 642* 356* 273* 179* 123*  ALKPHOS 139* 124 122 125 149*  BILITOT 1.0 0.4 0.5 0.5 0.8  PROT 5.8* 4.9* 4.9* 4.7* 4.7*  ALBUMIN 2.3* 2.0* 1.9* 1.9* 1.9*   CBC: Recent Labs  Lab 01/10/21 0252 01/11/21 0914 01/12/21 0552 01/13/21 0539 01/14/21 0504  WBC 23.8* 21.7* 21.5* 18.2* 13.7*  NEUTROABS  --  18.4* 17.4* 13.5* 10.3*  HGB 11.2* 10.6* 10.6* 10.5* 10.0*  HCT 32.7* 31.8* 32.4* 31.9* 31.0*  MCV 81.5 84.8 86.2 85.3 86.6  PLT 197 217 221 245 250   CBG: Recent Labs  Lab 01/09/21 0231  GLUCAP 93   Hgb A1c No results for input(s): HGBA1C in the last 72 hours. Lipid Profile No results for input(s): CHOL, HDL, LDLCALC, TRIG, CHOLHDL, LDLDIRECT in the last 72 hours. Thyroid function studies No results for input(s): TSH, T4TOTAL, T3FREE, THYROIDAB in the last 72  hours.  Invalid input(s): FREET3 Urinalysis No results found for: COLORURINE, APPEARANCEUR, LABSPEC, PHURINE, GLUCOSEU, HGBUR, BILIRUBINUR, KETONESUR, PROTEINUR, UROBILINOGEN, NITRITE, LEUKOCYTESUR  FURTHER DISCHARGE INSTRUCTIONS:   Get Medicines reviewed and adjusted: Please take all your medications with you for your next visit with your Primary MD   Laboratory/radiological data: Please request your Primary MD to go over all hospital tests and procedure/radiological results at the follow up, please ask your Primary MD to get all Hospital records sent to his/her office.   In some cases, they will be blood work, cultures and biopsy results pending at the time of your discharge. Please request that your primary care M.D. goes through all the records of your hospital data and follows up on these results.   Also Note the following: If you experience worsening of your admission symptoms, develop shortness of breath, life threatening emergency, suicidal or homicidal thoughts you must seek medical attention immediately by calling 911 or calling your MD immediately  if symptoms less severe.   You must read complete instructions/literature along with all the possible adverse reactions/side effects for all the Medicines you take and that have been prescribed to you. Take any new Medicines after you have completely understood and accpet all the possible adverse reactions/side effects.    Do not drive when taking Pain medications or sleeping medications (Benzodaizepines)   Do not take more than prescribed Pain, Sleep and Anxiety Medications. It is not advisable to combine anxiety,sleep and pain medications without talking with your primary care practitioner   Special Instructions: If you have smoked or chewed Tobacco  in the last 2 yrs please stop smoking, stop any regular Alcohol  and or any Recreational drug  use.   Wear Seat belts while driving.   Please note: You were cared for by a hospitalist  during your hospital stay. Once you are discharged, your primary care physician will handle any further medical issues. Please note that NO REFILLS for any discharge medications will be authorized once you are discharged, as it is imperative that you return to your primary care physician (or establish a relationship with a primary care physician if you do not have one) for your post hospital discharge needs so that they can reassess your need for medications and monitor your lab values.  Time coordinating discharge: 40 minutes  SIGNED:  Marzetta Board, MD, PhD 01/15/2021, 12:32 PM

## 2021-01-15 NOTE — Discharge Instructions (Signed)
Continue doxycycline for 3 additional days Continue oral vancomycin 4 times daily for 3 additional days, then start taking oral vancomycin twice daily for the duration of your intravenous antibiotic treatment  You will be on penicillin and metronidazole through 7/17

## 2021-01-15 NOTE — Progress Notes (Signed)
PHARMACY CONSULT NOTE FOR:  OUTPATIENT  PARENTERAL ANTIBIOTIC THERAPY (OPAT)  Indication: Liver Abscess  Regimen: Penicillin G 24 million units daily administered as a continuous infusion. Patient will also take Flagyl 500mg  PO BID for the duration End date: 02/08/2021  IV antibiotic discharge orders are pended. To discharging provider:  please sign these orders via discharge navigator,  Select New Orders & click on the button choice - Manage This Unsigned Work.     Thank you for allowing pharmacy to be a part of this patient's care.  02/10/2021 01/15/2021, 10:18 AM

## 2021-01-15 NOTE — Progress Notes (Signed)
IR.  Hepatic abscess s/p hepatic abscess drain placement in IR 01/11/2021.  Repeat CT abdomen/pelvis revealed improvement but still present hepatic abscess. Discussed case with Dr. Grace Isaac who states drain to remain at this time.  If patient is to be discharged, below are discharge instructions: - Flush each drain twice daily with 5-10 cc NS flush (patient will need an order for flushes upon discharge). RN aware to teach patient how to manage drains at home. - Record output from each drain once daily. Drain card given to RN to give to patient - Follow-up at drain clinic next week for CT/possible drain injection (assess for possible drain removal)- IR schedulers to call patient to set up this appointment.  Please call IR with questions/concerns.   Waylan Boga Celsa Nordahl, PA-C 01/15/2021, 12:08 PM

## 2021-01-15 NOTE — Progress Notes (Signed)
Referring Physician(s): Zigmund Daniel [facility] [place])  Supervising Physician: Simonne Come  Patient Status:  [facility] - In-pt  Chief Complaint:  Hepatic abscess s/p hepatic abscess drain placement in IR 01/11/2021.  Subjective:  Patient awake and alert sitting in chair with no complaints at this time. Excited to go home today. Hepatic abscess drain site c/d/i.   Allergies: Patient has no active allergies.  Medications: Prior to Admission medications   Medication Sig Start Date End Date Taking? Authorizing Provider  albuterol (VENTOLIN HFA) 108 (90 Base) MCG/ACT inhaler Inhale 1 puff into the lungs every 6 (six) hours as needed for wheezing or shortness of breath. 01/05/21  Yes [provider]  atenolol (TENORMIN) 25 MG tablet Take 25 mg by mouth daily. 07/10/20  Yes [provider]  cetirizine (ZYRTEC) 10 MG tablet Take 10 mg by mouth daily.   Yes [provider]  DM-APAP-CPM (CORICIDIN HBP) 10-325-2 MG TABS Take 1 tablet by mouth daily as needed (cough).   Yes [provider]  hydrochlorothiazide (HYDRODIURIL) 12.5 MG tablet Take 12.5 mg by mouth daily. 07/10/20  Yes [provider]  montelukast (SINGULAIR) 10 MG tablet Take 10 mg by mouth daily as needed (allergy). 07/10/20  Yes [provider]  Multiple Vitamin (MULTIVITAMIN ADULT PO) Take 1 tablet by mouth daily.   Yes [provider]  oxymetazoline (AFRIN) 0.05 % nasal spray Place 1 spray into both nostrils daily as needed for congestion.   Yes [provider]     Vital Signs: BP 133/76 (BP Location: Left Arm)   Pulse 86   Temp 98.6 F (37 C) (Oral)   Resp 20   Ht  (1.575 m)   Wt 184 lb 11.9 oz (83.8 kg)   SpO2 97%   BMI 33.79 kg/m   Physical Exam Vitals and nursing note reviewed.  Constitutional:      General: She is not in acute distress. Pulmonary:     Effort: Pulmonary effort is normal. No respiratory distress.  Abdominal:      Comments: Hepatic abscess drain site without tenderness, erythema, drainage, or active bleeding; approximately 5-10 cc clear yellow fluid with debris in suction bulb; drain flushes/aspirates without resistance.  Skin:    General: Skin is warm and dry.  Neurological:     Mental Status: She is alert and oriented to person, place, and time.    Imaging: CT Angio Chest Pulmonary Embolism (PE) W or WO Contrast  Result Date: 01/13/2021 CLINICAL DATA:  Chest pain and shortness of breath. EXAM: CT ANGIOGRAPHY CHEST WITH CONTRAST TECHNIQUE: Multidetector CT imaging of the chest was performed using the standard protocol during bolus administration of intravenous contrast. Multiplanar CT image reconstructions and MIPs were obtained to evaluate the vascular anatomy. CONTRAST:  80mL OMNIPAQUE IOHEXOL 350 MG/ML SOLN COMPARISON:  10/11/2017 FINDINGS: Cardiovascular: Satisfactory position of the pulmonary arteries to the segmental level. Respiratory motion artifact diminishes exam detail however. No suspicious filling defects identified to suggest acute pulmonary embolus. Normal heart size. No pericardial effusion. Aortic atherosclerosis and coronary artery calcifications. Mediastinum/Nodes: Normal appearance of the thyroid gland. The trachea appears patent and is midline. Normal appearance of the esophagus. Prominent mediastinal lymph nodes are identified. The largest is in the subcarinal region measuring 1.5 cm, image 19/5. No axillary or supraclavicular adenopathy. Lungs/Pleura: Small bilateral pleural effusions are identified right greater than left. Bilateral lower lung zone predominant increase interlobular septal thickening and ground-glass attenuation is noted concerning for pulmonary edema. Subsegmental atelectasis  noted within both lower lung zones. No signs of airspace consolidation or pneumothorax. Calcified granuloma noted within the right middle lobe. Upper Abdomen: There is a percutaneous drainage catheter  within segment scratch set percutaneous drainage catheter identified within the liver. The fluid collection is suboptimally evaluated due to lack of IV contrast material. Smaller lateral fluid collection within right hepatic lobe measures 1.8 cm, image 111/4. Previously 1.9 cm. Musculoskeletal: No chest wall abnormality. No acute or significant osseous findings. Review of the MIP images confirms the above findings. IMPRESSION: 1. Exam detail is diminished secondary to respiratory motion artifact. No evidence for acute pulmonary embolus. 2. Small bilateral pleural effusions, right greater than left. Bilateral lower lung zone predominant interlobular septal thickening and ground-glass attenuation concerning for pulmonary edema. Correlate for any clinical signs or symptoms of congestive heart failure. 3. Prominent mediastinal lymph nodes are identified measuring up to 1.5 cm. In the setting of CHF this is a nonspecific finding. 4. Aortic atherosclerosis and coronary artery calcifications. 5. Status post percutaneous drainage catheter placement in the liver for abscess. Aortic Atherosclerosis (ICD10-I70.0). Electronically Signed   By: Signa Kell M.D.   On: 01/13/2021 22:29   EEG adult  Result Date: 01/12/2021 Charlsie Quest, MD     01/12/2021  5:42 PM Patient Name: Madalene Mickler MRN: [id number] Epilepsy Attending: Charlsie Quest Referring Physician/Provider: Dr Lacretia Nicks Date: 01/12/2021 Duration: 22.30 mins Patient history: 70yo M with ams. EEG to evaluate for seizure Level of alertness: Awake, asleep AEDs during EEG study: None Technical aspects: This EEG study was done with scalp electrodes positioned according to the 10-20 International system of electrode placement. Electrical activity was acquired at a sampling rate of 500Hz  and reviewed with a high frequency filter of 70Hz  and a low frequency filter of 1Hz . EEG data were recorded continuously and digitally stored. Description: The posterior  dominant rhythm consists of 9 Hz activity of moderate voltage (25-35 uV) seen predominantly in posterior head regions, symmetric and reactive to eye opening and eye closing. Sleep was characterized by vertex waves, sleep spindles (12 to 14 Hz), maximal frontocentral region. Hyperventilation and photic stimulation were not performed.   IMPRESSION: This study is within normal limits. No seizures or epileptiform discharges were seen throughout the recording.   ECHOCARDIOGRAM COMPLETE  Result Date: 01/11/2021    ECHOCARDIOGRAM REPORT   Patient Name:   MAURINE MOWBRAY Date of Exam: 01/11/2021 Medical Rec #:  01/13/2021     Height:       62.0 in Accession #:    [id number]    Weight:       184.7 lb Date of Birth:  [birth year]     BSA:          1.848 m Patient Age:    69 years      BP:           124/64 mmHg Patient Gender: F             HR:           83 bpm. Exam Location:  Inpatient Procedure: 2D Echo, 3D Echo, Cardiac Doppler, Color Doppler and Strain Analysis Indications:    Bacteremia R78.81  History:        Patient has no prior history of Echocardiogram examinations.                 GERD, Hashimoto's thyroiditis, Thrombocytopenia, Acute  vasculopathy sepsis, Strep Intermedius Bacteremia.  Sonographer:    Leta Jungling RDCS Referring Phys: [id number] [facility] IMPRESSIONS  1. Left ventricular ejection fraction, by estimation, is 60 to 65%. Left ventricular ejection fraction by 3D volume is 62 %. The left ventricle has normal function. The left ventricle has no regional wall motion abnormalities. Left ventricular diastolic  parameters are consistent with Grade I diastolic dysfunction (impaired relaxation).  2. Right ventricular systolic function is normal. The right ventricular size is normal.  3. The mitral valve is abnormal. trivial to mild mitral valve regurgitation.  4. The aortic valve is tricuspid. Aortic valve regurgitation is not visualized.  5. The inferior vena cava is normal  in size with <50% respiratory variability, suggesting right atrial pressure of 8 mmHg. Comparison(s): No prior Echocardiogram. Conclusion(s)/Recommendation(s): No evidence of valvular vegetations on this transthoracic echocardiogram. Would recommend a transesophageal echocardiogram to exclude infective endocarditis if clinically indicated. FINDINGS  Left Ventricle: Left ventricular ejection fraction, by estimation, is 60 to 65%. Left ventricular ejection fraction by 3D volume is 62 %. The left ventricle has normal function. The left ventricle has no regional wall motion abnormalities. The left ventricular internal cavity size was normal in size. There is no left ventricular hypertrophy. Left ventricular diastolic parameters are consistent with Grade I diastolic dysfunction (impaired relaxation). Indeterminate filling pressures. Right Ventricle: The right ventricular size is normal. No increase in right ventricular wall thickness. Right ventricular systolic function is normal. Left Atrium: Left atrial size was normal in size. Right Atrium: Right atrial size was normal in size. Pericardium: There is no evidence of pericardial effusion. Mitral Valve: The mitral valve is abnormal. There is mild thickening of the mitral valve leaflet(s). Trivial to mild mitral valve regurgitation, with posteriorly-directed jet. Tricuspid Valve: The tricuspid valve is grossly normal. Tricuspid valve regurgitation is trivial. Aortic Valve: The aortic valve is tricuspid. Aortic valve regurgitation is not visualized. Pulmonic Valve: The pulmonic valve was grossly normal. Pulmonic valve regurgitation is trivial. Aorta: The aortic root and ascending aorta are structurally normal, with no evidence of dilitation. Venous: The inferior vena cava is normal in size with less than 50% respiratory variability, suggesting right atrial pressure of 8 mmHg. IAS/Shunts: The interatrial septum was not well visualized.  LEFT VENTRICLE PLAX 2D LVIDd:          4.90 cm         Diastology LVIDs:         3.30 cm         LV e' medial:    6.19 cm/s LV PW:         0.80 cm         LV E/e' medial:  11.4 LV IVS:        0.90 cm         LV e' lateral:   9.13 cm/s LVOT diam:     2.00 cm         LV E/e' lateral: 7.7 LV SV:         60 LV SV Index:   33 LVOT Area:     3.14 cm        3D Volume EF                                LV 3D EF:    Left  ventricular                                             ejection                                             fraction by                                             3D volume                                             is 62 %.                                 3D Volume EF:                                3D EF:        62 % RIGHT VENTRICLE RV S prime:     12.00 cm/s TAPSE (M-mode): 1.7 cm LEFT ATRIUM             Index       RIGHT ATRIUM          Index LA diam:        3.30 cm 1.79 cm/m  RA Area:     8.17 cm LA Vol (A2C):   26.6 ml 14.39 ml/m RA Volume:   13.90 ml 7.52 ml/m LA Vol (A4C):   27.4 ml 14.82 ml/m LA Biplane Vol: 28.0 ml 15.15 ml/m  AORTIC VALVE LVOT Vmax:   88.60 cm/s LVOT Vmean:  65.100 cm/s LVOT VTI:    0.192 m  AORTA Ao Root diam: 3.00 cm Ao Asc diam:  2.90 cm MITRAL VALVE MV Area (PHT): 5.27 cm    SHUNTS MV Decel Time: 144 msec    Systemic VTI:  0.19 m MV E velocity: 70.70 cm/s  Systemic Diam: 2.00 cm MV A velocity: 86.00 cm/s MV E/A ratio:  0.82 Zoila ShutterKenneth Hilty MD Electronically signed by Zoila ShutterKenneth Hilty MD Signature Date/Time: 01/11/2021/1:15:30 PM    Final    ECHO TEE  Result Date: 01/13/2021    TRANSESOPHOGEAL ECHO REPORT   Patient Name:   Liana GeroldNNETTE Loh Date of Exam: 01/13/2021 Medical Rec #:  [id number]     Height:       62.0 in Accession #:    [id number]    Weight:       184.7 lb Date of Birth:  12-19-1950     BSA:          1.848 m Patient Age:    69 years      BP:           151/80 mmHg Patient Gender: F             HR:           95 bpm. Exam Location:  Inpatient Procedure: 3D  Echo, Transesophageal Echo, Cardiac Doppler and Color Doppler Indications:     Bacteremia  History:         Patient has prior history of Echocardiogram examinations, most                  recent 01/11/2021. Risk Factors:Hypertension and Dyslipidemia.  Sonographer:     Sheralyn Boatman RDCS Referring Phys:  [id number] Tacey Ruiz DUNN Diagnosing Phys: Epifanio Lesches MD PROCEDURE: After discussion of the risks and benefits of a TEE, an informed consent was obtained from the patient. The transesophogeal probe was passed without difficulty through the esophogus of the patient. Imaged were obtained with the patient in a left lateral decubitus position. Sedation performed by different physician. The patient was monitored while under deep sedation. Anesthestetic sedation was provided intravenously by Anesthesiology:  of Propofol. The patient developed no complications during the procedure. IMPRESSIONS  1. Left ventricular ejection fraction, by estimation, is 55 to 60%. The left ventricle has normal function.  2. Right ventricular systolic function is normal. The right ventricular size is normal. There is severely elevated pulmonary artery systolic pressure.  3. No left atrial/left atrial appendage thrombus was detected.  4. Right atrial size was mildly dilated.  5. The mitral valve is normal in structure. Mild mitral valve regurgitation.  6. The aortic valve is tricuspid. Aortic valve regurgitation is not visualized.  7. The tricuspid valve is abnormal. Tricuspid valve regurgitation is moderate. 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 The left ventricle has normal function. The left ventricular internal cavity size was normal in size. Right Ventricle: The right ventricular size is normal. No increase in right ventricular wall thickness. Right ventricular systolic function is normal. There is severely  elevated pulmonary artery systolic pressure. The tricuspid regurgitant velocity is 3.89 m/s, and with an assumed right atrial pressure of 8 mmHg, the estimated right ventricular systolic pressure is 68.5 mmHg. Left Atrium: Left atrial size was normal in size. No left atrial/left atrial appendage thrombus was detected. Right Atrium: Right atrial size was mildly dilated. Pericardium: There is no evidence of pericardial effusion. Mitral Valve: The mitral valve is normal in structure. Mild mitral valve regurgitation. Tricuspid Valve: The tricuspid valve is abnormal. Tricuspid valve regurgitation is moderate. Aortic Valve: The aortic valve is tricuspid. Aortic valve regurgitation is not visualized. Pulmonic Valve: The pulmonic valve was grossly normal. Pulmonic valve regurgitation is trivial. Aorta: The aortic root is normal in size and structure. IAS/Shunts: No atrial level shunt detected by color flow Doppler.  TRICUSPID VALVE TR Peak grad:   60.5 mmHg TR Vmax:        389.00 cm/s Epifanio Lesches MD Electronically signed by Epifanio Lesches MD Signature Date/Time: 01/13/2021/4:14:39 PM    Final    CT IMAGE GUIDED FLUID DRAIN BY CATHETER  Result Date: 01/13/2021 CLINICAL DATA:  Multifocal hepatic abscess with a dominant 6.8 cm component EXAM: CT GUIDED DRAINAGE OF HEPATIC ABSCESS ANESTHESIA/SEDATION: Intravenous Fentanyl and Versed  were administered as conscious sedation during continuous monitoring of the patient's level of consciousness and physiological / cardiorespiratory status by the radiology RN, with a total moderate sedation time of 17 minutes. PROCEDURE: The procedure, risks, benefits, and alternatives were explained to the patient. Questions regarding the procedure were encouraged and answered. The patient understands and consents to the procedure. Select axial scans through the liver were  obtained. The dominant collection was localized and an appropriate skin entry site was determined  and marked. The operative field was prepped with chlorhexidinein a sterile fashion, and a sterile drape was applied covering the operative field. A sterile gown and sterile gloves were used for the procedure. Local anesthesia was provided with 1% Lidocaine. Under CT fluoroscopic guidance with gantry angulation, 18 gauge trocar needle advanced to the collection. Purulent material could be aspirated. Amplatz guidewire advanced easily within the collection, position confirmed on CT. Tract dilated to facilitate placement of a 12 French pigtail drain catheter, formed centrally within the collection. 30 mL purulent aspirate sent for Gram stain, culture and sensitivity. Catheter secured externally with 0 Prolene suture and StatLock and placed to gravity drain bag. The patient tolerated the procedure well. COMPLICATIONS: None immediate FINDINGS: The dominant component in the right lobe was localized. 12 French pigtail drain catheter placed as above from a subcostal approach. A sample of the aspirate sent for Gram stain and culture. IMPRESSION: 1. Technically successful CT-guided hepatic abscess drain catheter placement Electronically Signed   By: Corlis Leak M.D.   On: 01/13/2021 07:50   [place] EKG SITE RITE  Result Date: 01/14/2021 If [facility] image not attached, placement could not be confirmed due to current cardiac rhythm.   Labs:  CBC: Recent Labs    01/11/21 0914 01/12/21 0552 01/13/21 0539 01/14/21 0504  WBC 21.7* 21.5* 18.2* 13.7*  HGB 10.6* 10.6* 10.5* 10.0*  HCT 31.8* 32.4* 31.9* 31.0*  PLT 217 221 245 250    COAGS: Recent Labs    01/12/21 0552  INR 1.3*    BMP: Recent Labs    01/11/21 0914 01/12/21 0552 01/13/21 0539 01/14/21 0504 01/15/21 0500  NA 136 138 140 139  --   K 3.8 3.9 3.8 3.8  --   CL 111 111 114* 112*  --   CO2 20* 20* 20* 21*  --   GLUCOSE 98 95 106* 110*  --   BUN 19 14 11 9   --   CALCIUM 7.5* 7.6* 7.7* 7.7*  --   CREATININE 0.94 0.98 0.77 0.76 0.75  GFRNONAA  >60 >60 >60 >60 >60    LIVER FUNCTION TESTS: Recent Labs    01/11/21 0914 01/12/21 0552 01/13/21 0539 01/14/21 0504  BILITOT 0.4 0.5 0.5 0.8  AST 124* 110* 44* 30  ALT 356* 273* 179* 123*  ALKPHOS 124 122 125 149*  PROT 4.9* 4.9* 4.7* 4.7*  ALBUMIN 2.0* 1.9* 1.9* 1.9*    Assessment and Plan:  Hepatic abscess s/p hepatic abscess drain placement in IR 01/11/2021. Hepatic abscess drain stable with approximately 5-10 cc clear yellow fluid with debris in suction bulb (additional 2.5 cc output from drain in past 24 hours per chart). Patient up for discharge today per Dr. 01/13/2021. Hepatic abscess drain output has been minimal over the past few days. Discussed case with Dr. Elvera Lennox who recommends repeat CT abdomen/pelvis (IV contrast only) prior to discharge. Further plans per [facility]- appreciate and agree with management. IR to follow.   Electronically Signed: [facility], PA-C 01/15/2021, 9:38 AM   I spent a total of 15 Minutes at the the patient's bedside AND on the patient's hospital floor or unit, greater than 50% of which was counseling/coordinating care for hepatic abscess s/p hepatic abscess drain placement.

## 2021-01-16 ENCOUNTER — Other Ambulatory Visit: Payer: Self-pay | Admitting: Infectious Diseases

## 2021-01-16 DIAGNOSIS — K750 Abscess of liver: Secondary | ICD-10-CM

## 2021-01-17 LAB — AEROBIC/ANAEROBIC CULTURE W GRAM STAIN (SURGICAL/DEEP WOUND)

## 2021-01-19 ENCOUNTER — Ambulatory Visit: Payer: 59 | Admitting: Nurse Practitioner

## 2021-01-22 ENCOUNTER — Encounter: Payer: Self-pay | Admitting: *Deleted

## 2021-01-22 ENCOUNTER — Encounter (HOSPITAL_COMMUNITY): Payer: 59

## 2021-01-22 ENCOUNTER — Ambulatory Visit
Admission: RE | Admit: 2021-01-22 | Discharge: 2021-01-22 | Disposition: A | Discharge status: Home / Self Care | Payer: 59 | Source: Ambulatory Visit | Attending: Student | Admitting: Student

## 2021-01-22 ENCOUNTER — Ambulatory Visit
Admission: RE | Admit: 2021-01-22 | Discharge: 2021-01-22 | Disposition: A | Discharge status: Home / Self Care | Payer: 59 | Source: Ambulatory Visit | Attending: Infectious Diseases | Admitting: Infectious Diseases

## 2021-01-22 ENCOUNTER — Other Ambulatory Visit (HOSPITAL_COMMUNITY): Payer: Self-pay | Admitting: Physician Assistant

## 2021-01-22 DIAGNOSIS — K750 Abscess of liver: Secondary | ICD-10-CM

## 2021-01-22 HISTORY — PX: IR RADIOLOGIST EVAL & MGMT: IMG5224

## 2021-01-22 MED ORDER — IOPAMIDOL (ISOVUE-300) INJECTION 61%
100.0000 mL | Freq: Once | INTRAVENOUS | Status: AC | PRN
  Administered 2021-01-22: 100 mL via INTRAVENOUS

## 2021-01-22 NOTE — Progress Notes (Signed)
Chief Complaint: Patient was seen in consultation today for hepatic abscess follow up. Referring Physician(s): Ronney Lion  Supervising Physician: Corrie Mckusick  History of Present Illness: Brenda Morse is a 70 y.o. female with past medical history significant for Hasimoto's, essential tremor,HTN, thrombocytopenia, GERD, gastric ulcer, diverticular disease and hepatic abscess s/p percutaneous drainage 01/11/21 in IR seen today for drain follow up. Brenda Morse presented to [facility] on 6/15 with complaints of abdominal pain and profuse diarrhea (up to 40 times per day), she was found to have significant leukocytosis with elevated LFTs and AKI. Non contrast CT showed several indeterminate hepatic lesions. She was transferred to [facility] for further evaluation, MRI liver w/o contrast showed multiple lesions throughout the liver with largest in central right lobe (6.9 cm) suspicious for metastatic disease. Follow up MRI liver with and w/o contrast showed this large lesion to be a complex cystic lesion with multiple smaller liver lesions showing peripheral rim enhancement, likely multiple hepatic abscesses. IR was consulted and percutaneous hepatic abscess drain was placed on 6/19 by Dr. Vernard Gambles.  She underwent a repeat CT abd/pelvis w/contrast on 6/23 which showed the drain to be in good position with reduction in the size of the abscess from 6.6 cm to 5.5 cm. Her imaging was reviewed by Dr. Pascal Lux who did not recommend further drain placement and she was subsequently discharged home that same day with instructions to continue BID flushes and record output QD. She presents to IR clinic today for repeat CT scan and drain evaluation.  Brenda Morse reports that she has been flushing her drain once daily with about 5 cc of normal saline, she does not empty the drain everyday as not very much comes out each day - she estimates maybe 10 cc of clear yellow fluid comes out each day, she last emptied her  drain yesterday which was 85 cc and had been about a week since her last emptying. The drain flushes easily without leakage or pain. She has not had any fevers, chills, abdominal pain, n/v. Her only complaint today is leg pain.  Past Medical History:  Diagnosis Date   Allergy    Asthma    when allergies flare   GERD (gastroesophageal reflux disease)    Hypertension     Past Surgical History:  Procedure Laterality Date   PARTIAL HYSTERECTOMY  1986   TEE WITHOUT CARDIOVERSION N/A 01/13/2021   Procedure: TRANSESOPHAGEAL ECHOCARDIOGRAM (TEE);  Surgeon: Donato Heinz, MD;  Location: [facility] ENDOSCOPY;  Service: Cardiovascular;  Laterality: N/A;   TONSILLECTOMY AND ADENOIDECTOMY Bilateral    age 36    Allergies: Patient has no active allergies.  Medications: Prior to Admission medications   Medication Sig Start Date End Date Taking? Authorizing Provider  albuterol (VENTOLIN HFA) 108 (90 Base) MCG/ACT inhaler Inhale 1 puff into the lungs every 6 (six) hours as needed for wheezing or shortness of breath. 01/05/21   [provider]  atenolol (TENORMIN) 25 MG tablet Take 25 mg by mouth daily. 07/10/20   [provider]  cetirizine (ZYRTEC) 10 MG tablet Take 10 mg by mouth daily.    [provider]  DM-APAP-CPM (CORICIDIN HBP) 10-325-2 MG TABS Take 1 tablet by mouth daily as needed (cough).    [provider]  EPINEPHrine 0.3 mg/0.3 mL IJ SOAJ injection Inject 0.3 mg into the muscle once as needed (if patient exhibits significant signs and symptoms of allergic reaction.). 01/15/21   Caren Griffins, MD  hydrochlorothiazide (HYDRODIURIL) 12.5  MG tablet Take 12.5 mg by mouth daily. 07/10/20   [provider]  metroNIDAZOLE (FLAGYL) 500 MG tablet Take 1 tablet (500 mg total) by mouth 2 (two) times daily for 24 days. 01/15/21 02/08/21  Caren Griffins, MD  montelukast (SINGULAIR) 10 MG tablet Take 10 mg by mouth daily as needed (allergy). 07/10/20    [provider]  Multiple Vitamin (MULTIVITAMIN ADULT PO) Take 1 tablet by mouth daily.    [provider]  oxymetazoline (AFRIN) 0.05 % nasal spray Place 1 spray into both nostrils daily as needed for congestion.    [provider]  penicillin G IVPB Inject 24 Million Units into the vein daily for 24 days. As a continuous infusion. Indication:  Liver abscess First Dose: No Last Day of Therapy:  02/08/2021 Labs - Once weekly:  CBC/D and BMP, Labs - Every other week:  ESR and CRP Method of administration: Elastomeric (Continuous infusion) Method of administration may be changed at the discretion of home infusion pharmacist based upon assessment of the patient and/or caregiver's ability to self-administer the medication ordered. 01/15/21 02/08/21  Caren Griffins, MD  vancomycin (VANCOCIN) 125 MG capsule Take 1 capsule (125 mg total) by mouth in the morning and at bedtime for 24 days. 01/19/21 02/12/21  Caren Griffins, MD     Family History  Problem Relation Age of Onset   Colon cancer Brother 7   Rectal cancer Brother    Esophageal cancer Neg Hx    Stomach cancer Neg Hx     Social History   Socioeconomic History   Marital status: Married    Spouse name: Not on file   Number of children: Not on file   Years of education: Not on file   Highest education level: Not on file  Occupational History   Not on file  Tobacco Use   Smoking status: Never   Smokeless tobacco: Never  Vaping Use   Vaping Use: Never used  Substance and Sexual Activity   Alcohol use: Not Currently   Drug use: Never   Sexual activity: Not on file  Other Topics Concern   Not on file  Social History Narrative   Not on file   Social Determinants of Health   Financial Resource Strain: Not on file  Food Insecurity: Not on file  Transportation Needs: Not on file  Physical Activity: Not on file  Stress: Not on file  Social Connections: Not on file    Review of Systems: A 12  point ROS discussed and pertinent positives are indicated in the HPI above.  All other systems are negative.  Review of Systems  Constitutional:  Negative for chills and fever.  Respiratory:  Negative for shortness of breath.   Cardiovascular:  Negative for chest pain.  Gastrointestinal:  Negative for abdominal pain, nausea and vomiting.  Musculoskeletal:        (+) leg pain   Vital Signs: There were no vitals taken for this visit.  Physical Exam Constitutional:      General: She is not in acute distress. HENT:     Head: Normocephalic.  Pulmonary:     Effort: Pulmonary effort is normal.  Abdominal:     Comments: (+) RUQ drain to suction with ~10 cc serous output. Suture and stat lock in tact. Dressed appropriately. No bleeding or drainage noted. Non tender.  Neurological:     Mental Status: She is alert.    Imaging: CT HEAD WO CONTRAST  Result Date:  01/09/2021 CLINICAL DATA:  Delirium.  Confusion. EXAM: CT HEAD WITHOUT CONTRAST TECHNIQUE: Contiguous axial images were obtained from the base of the skull through the vertex without intravenous contrast. COMPARISON:  None. FINDINGS: Brain: No acute infarct, hemorrhage, or mass lesion is present. The ventricles are of normal size. No significant extraaxial fluid collection is present. The craniocervical junction is normal. Upper cervical spine is within normal limits. Marrow signal is unremarkable. Vascular: Atherosclerotic calcifications are present within the cavernous internal carotid arteries bilaterally. No hyperdense vessel is present. Skull: Calvarium is intact. No focal lytic or blastic lesions are present. No significant extracranial soft tissue lesion is present. Sinuses/Orbits: The paranasal sinuses and mastoid air cells are clear. The globes and orbits are within normal limits. IMPRESSION: 1. Normal CT appearance of the brain. 2. Atherosclerosis. Electronically Signed   By: San Morelle M.D.   On: 01/09/2021 15:12   CT  Angio Chest Pulmonary Embolism (PE) W or WO Contrast  Result Date: 01/13/2021 CLINICAL DATA:  Chest pain and shortness of breath. EXAM: CT ANGIOGRAPHY CHEST WITH CONTRAST TECHNIQUE: Multidetector CT imaging of the chest was performed using the standard protocol during bolus administration of intravenous contrast. Multiplanar CT image reconstructions and MIPs were obtained to evaluate the vascular anatomy. CONTRAST:  27mL OMNIPAQUE IOHEXOL 350 MG/ML SOLN COMPARISON:  10/11/2017 FINDINGS: Cardiovascular: Satisfactory position of the pulmonary arteries to the segmental level. Respiratory motion artifact diminishes exam detail however. No suspicious filling defects identified to suggest acute pulmonary embolus. Normal heart size. No pericardial effusion. Aortic atherosclerosis and coronary artery calcifications. Mediastinum/Nodes: Normal appearance of the thyroid gland. The trachea appears patent and is midline. Normal appearance of the esophagus. Prominent mediastinal lymph nodes are identified. The largest is in the subcarinal region measuring 1.5 cm, image 19/5. No axillary or supraclavicular adenopathy. Lungs/Pleura: Small bilateral pleural effusions are identified right greater than left. Bilateral lower lung zone predominant increase interlobular septal thickening and ground-glass attenuation is noted concerning for pulmonary edema. Subsegmental atelectasis noted within both lower lung zones. No signs of airspace consolidation or pneumothorax. Calcified granuloma noted within the right middle lobe. Upper Abdomen: There is a percutaneous drainage catheter within segment scratch set percutaneous drainage catheter identified within the liver. The fluid collection is suboptimally evaluated due to lack of IV contrast material. Smaller lateral fluid collection within right hepatic lobe measures 1.8 cm, image 111/4. Previously 1.9 cm. Musculoskeletal: No chest wall abnormality. No acute or significant osseous findings.  Review of the MIP images confirms the above findings. IMPRESSION: 1. Exam detail is diminished secondary to respiratory motion artifact. No evidence for acute pulmonary embolus. 2. Small bilateral pleural effusions, right greater than left. Bilateral lower lung zone predominant interlobular septal thickening and ground-glass attenuation concerning for pulmonary edema. Correlate for any clinical signs or symptoms of congestive heart failure. 3. Prominent mediastinal lymph nodes are identified measuring up to 1.5 cm. In the setting of CHF this is a nonspecific finding. 4. Aortic atherosclerosis and coronary artery calcifications. 5. Status post percutaneous drainage catheter placement in the liver for abscess. Aortic Atherosclerosis (ICD10-I70.0). Electronically Signed   By: Kerby Moors M.D.   On: 01/13/2021 22:29   MR BRAIN WO CONTRAST  Result Date: 01/10/2021 CLINICAL DATA:  Delirium. EXAM: MRI HEAD WITHOUT CONTRAST TECHNIQUE: Multiplanar, multiecho pulse sequences of the brain and surrounding structures were obtained without intravenous contrast. COMPARISON:  None. FINDINGS: Brain: No acute infarct, hemorrhage, or mass lesion is present. No significant white matter lesions are present. The ventricles are  of normal size. No significant extraaxial fluid collection is present. The internal auditory canals are within normal limits. The brainstem and cerebellum are within normal limits. Vascular: Flow is present in the major intracranial arteries. Skull and upper cervical spine: The craniocervical junction is normal. Upper cervical spine is within normal limits. Marrow signal is unremarkable. Sinuses/Orbits: The paranasal sinuses and mastoid air cells are clear. The globes and orbits are within normal limits. IMPRESSION: Negative MRI of the brain. Electronically Signed   By: San Morelle M.D.   On: 01/10/2021 14:35   CT ABDOMEN PELVIS W CONTRAST  Result Date: 01/15/2021 CLINICAL DATA:  70 year old  female with a history of hepatic abscess EXAM: CT ABDOMEN AND PELVIS WITH CONTRAST TECHNIQUE: Multidetector CT imaging of the abdomen and pelvis was performed using the standard protocol following bolus administration of intravenous contrast. CONTRAST:  163m OMNIPAQUE IOHEXOL 300 MG/ML  SOLN COMPARISON:  Chest CT 10/11/2017, abdominal CT 01/08/2021, MR abdomen 01/10/2021 FINDINGS: Lower chest: Trace right-sided pleural effusion. Atelectatic changes/scarring bilateral lung bases. Interlobular septal thickening. Hepatobiliary: Pigtail drainage catheter via anterior right subcostal approach terminates within hepatic abscess, involving predominantly segment 7. There is residual abscess with dimensions of 5.5 cm x 5.2 cm on the axial images, decreased from the baseline of 6.6 cm. The small subcapsular hypodense/hypoenhancing lesion of the left liver, segment 3, and the right-sided additional rounded foci in segment 8 (2.0 cm) and segment 6 (7 mm and 6 mm) were not present on the remote chest CT and are favored to represent separate small abscesses, as was discussed on the MRI 01/11/2021. Differential attenuation/enhancement of right liver, compatible with a perfusion defect secondary to the presence of the abscess/edema. Dense material at the dependent aspects of the gallbladder, compatible with biliary sludge/low microlithiasis. No inflammatory changes. Pancreas: Ill-defined fluid and gas with internal density at the pancreatic groove measuring 16 mm, favored to represent a duodenal diverticulum. This was present on comparison CT scan. Unremarkable pancreatic parenchyma. Spleen: Unremarkable Adrenals/Urinary Tract: - Right adrenal gland:  Unremarkable - Left adrenal gland: Unremarkable. - Right kidney: No hydronephrosis, nephrolithiasis, inflammation, or ureteral dilation. No focal lesion. - Left Kidney: No hydronephrosis, nephrolithiasis, inflammation, or ureteral dilation. No focal lesion. - Urinary Bladder:  Unremarkable. Stomach/Bowel: - Stomach: Unremarkable. - Small bowel: Unremarkable - Appendix: Normal. - Colon: Colonic diverticula throughout the length of the colon. No inflammatory changes. No obstruction. Vascular/Lymphatic: Minimal atherosclerosis of the abdominal aorta. Mesenteric and renal arteries are patent. Bilateral iliac and proximal femoral arteries are patent. No inguinal or pelvic lymphadenopathy. Reproductive: Hysterectomy Other: Mild body wall and pelvic wall edema/anasarca. Musculoskeletal: Multilevel degenerative changes of the visualized thoracolumbar spine. Vacuum disc phenomenon of T11-T12, L4-L5, L5-S1. No significant bony canal narrowing. No acute displaced fracture. Mild degenerative changes of the hips. No aggressive lytic or sclerotic lesions identified. IMPRESSION: Adequately positioned hepatic abscess pigtail drainage catheter from right sub costal approach, within residual right hepatic abscess. Improving size, decreased from 6.6 cm, currently 5.5 cm. Additional small hypodense/hypoenhancing foci of the liver, most likely sites of separate hepatic abscess. These have not significantly changed size when compared to the MR 01/11/2021. Evidence of positive fluid balance, with mild pulmonary edema the lung bases, trace bilateral pleural effusions, and body wall anasarca/edema. Electronically Signed   By: JCorrie MckusickD.O.   On: 01/15/2021 12:36   MR LIVER WO CONRTAST  Result Date: 01/10/2021 CLINICAL DATA:  Indeterminate hepatic lesions on recent noncontrast CT. Nausea and vomiting. Diarrhea. EXAM: MRI ABDOMEN WITHOUT CONTRAST  TECHNIQUE: Multiplanar multisequence MR imaging was performed without the administration of intravenous contrast. COMPARISON:  Noncontrast CT on 01/08/2021 from [place]: Lower chest: No acute findings. Hepatobiliary: Multiple T2 hyperintense lesions are seen throughout the right and left lobes, largest in the central right hepatic lobe measuring  6.9 x 6.0 cm. These lesions cannot be completely characterized on this unenhanced exam, but are suspicious for metastatic disease with abscess is considered less likely. Gallbladder is unremarkable. No evidence of biliary ductal dilatation. Pancreas: No mass or inflammatory process visualized on this unenhanced exam. Spleen:  Within normal limits in size. Adrenals/Urinary tract: Unremarkable. No evidence of nephrolithiasis or hydronephrosis. Stomach/Bowel: Visualized portion unremarkable. Vascular/Lymphatic: No pathologically enlarged lymph nodes identified. No evidence of abdominal aortic aneurysm. Other:  None. Musculoskeletal:  No suspicious bone lesions identified. IMPRESSION: Multiple lesions throughout the liver, largest in the central right lobe measuring 6.9 cm. While these lesions cannot be completely characterized on this unenhanced exam, they are suspicious for metastatic disease with abscesses considered less likely. Consider abdomen MRI without and with contrast for further characterization. (Note that, unlike CT contrast, there is no shortage of gadolinium IV contrast for MRI). Electronically Signed   By: Marlaine Hind M.D.   On: 01/10/2021 15:00   MR LIVER W WO CONTRAST  Result Date: 01/11/2021 CLINICAL DATA:  Indeterminate liver lesions. EXAM: MRI ABDOMEN WITHOUT AND WITH CONTRAST TECHNIQUE: Multiplanar multisequence MR imaging of the abdomen was performed both before and after the administration of intravenous contrast. CONTRAST:  56m GADAVIST GADOBUTROL 1 MMOL/ML IV SOLN COMPARISON:  Noncontrast exams on 01/10/2021 and 01/08/2021 FINDINGS: Lower chest: Tiny right pleural effusion noted. Hepatobiliary: A complex cystic lesion is seen in the medial liver dome which shows numerous irregular enhancing internal septations and thin peripheral rim enhancement. This measures 6.8 x 6.3 cm on image 31/23. Multiple other smaller liver lesions are seen in both the right and left lobes which show thin  peripheral rim enhancement. No solid liver masses are identified. These characteristics favor multiple hepatic abscesses over metastatic disease. Gallbladder is unremarkable. No evidence of biliary ductal dilatation. Pancreas:  No mass or inflammatory changes. Spleen:  Within normal limits in size and appearance. Adrenals/Urinary Tract: No masses identified. Tiny sub-cm cyst noted in upper pole of right kidney. No evidence of hydronephrosis. Stomach/Bowel: Visualized portion unremarkable. Vascular/Lymphatic: No pathologically enlarged lymph nodes identified. No acute vascular findings. Other: Minimal perihepatic ascites noted, as well as generalized retroperitoneal and body wall edema. Musculoskeletal:  No suspicious bone lesions identified. IMPRESSION: Dominant 7 cm complex cystic lesion in the medial liver dome, with multiple smaller liver lesions showing thin peripheral rim enhancement. These characteristics favor multiple hepatic abscesses over metastatic disease. Minimal perihepatic ascites, as well as generalized retroperitoneal and body wall edema. Tiny right pleural effusion. Electronically Signed   By: JMarlaine HindM.D.   On: 01/11/2021 10:17   EEG adult  Result Date: 01/12/2021 YLora Havens MD     01/12/2021  5:42 PM Patient Name: AKylynn StreetMRN: [id number] Attending: PLora HavensReferring Physician/Provider: Dr CFayrene HelperDate: 01/12/2021 Duration: 22.30 mins Patient history: 89yoM with ams. EEG to evaluate for seizure Level of alertness: Awake, asleep AEDs during EEG study: None Technical aspects: This EEG study was done with scalp electrodes positioned according to the 10-20 International system of electrode placement. Electrical activity was acquired at a sampling rate of _0  and reviewed with a high frequency filter of _1  and a low frequency filter of  _0 . EEG data were recorded continuously and digitally stored. Description: The posterior dominant rhythm consists of  9 Hz activity of moderate voltage (25-35 uV) seen predominantly in posterior head regions, symmetric and reactive to eye opening and eye closing. Sleep was characterized by vertex waves, sleep spindles (12 to 14 Hz), maximal frontocentral region. Hyperventilation and photic stimulation were not performed.   IMPRESSION: This study is within normal limits. No seizures or epileptiform discharges were seen throughout the recording. Lora Havens   ECHOCARDIOGRAM COMPLETE  Result Date: 01/11/2021    ECHOCARDIOGRAM REPORT   Patient Name:   YAELIS SCHARFENBERG Date of Exam: 01/11/2021 Medical Rec #:  [id number]     Height:       62.0 in Accession #:    [id number]    Weight:       184.7 lb Date of Birth:  [birth year]     BSA:          1.848 m Patient Age:    68 years      BP:           124/64 mmHg Patient Gender: F             HR:           83 bpm. Exam Location:  Inpatient Procedure: 2D Echo, 3D Echo, Cardiac Doppler, Color Doppler and Strain Analysis Indications:    Bacteremia R78.81  History:        Patient has no prior history of Echocardiogram examinations.                 GERD, Hashimoto's thyroiditis, Thrombocytopenia, Acute                 vasculopathy sepsis, Strep Intermedius Bacteremia.  Sonographer:    Darlina Sicilian RDCS Referring Phys: [id number] [place]  1. Left ventricular ejection fraction, by estimation, is 60 to 65%. Left ventricular ejection fraction by 3D volume is 62 %. The left ventricle has normal function. The left ventricle has no regional wall motion abnormalities. Left ventricular diastolic  parameters are consistent with Grade I diastolic dysfunction (impaired relaxation).  2. Right ventricular systolic function is normal. The right ventricular size is normal.  3. The mitral valve is abnormal. trivial to mild mitral valve regurgitation.  4. The aortic valve is tricuspid. Aortic valve regurgitation is not visualized.  5. The inferior vena cava is normal in size with <50%  respiratory variability, suggesting right atrial pressure of 8 mmHg. Comparison(s): No prior Echocardiogram. Conclusion(s)/Recommendation(s): No evidence of valvular vegetations on this transthoracic echocardiogram. Would recommend a transesophageal echocardiogram to exclude infective endocarditis if clinically indicated. FINDINGS  Left Ventricle: Left ventricular ejection fraction, by estimation, is 60 to 65%. Left ventricular ejection fraction by 3D volume is 62 %. The left ventricle has normal function. The left ventricle has no regional wall motion abnormalities. The left ventricular internal cavity size was normal in size. There is no left ventricular hypertrophy. Left ventricular diastolic parameters are consistent with Grade I diastolic dysfunction (impaired relaxation). Indeterminate filling pressures. Right Ventricle: The right ventricular size is normal. No increase in right ventricular wall thickness. Right ventricular systolic function is normal. Left Atrium: Left atrial size was normal in size. Right Atrium: Right atrial size was normal in size. Pericardium: There is no evidence of pericardial effusion. Mitral Valve: The mitral valve is abnormal. There is mild thickening of the mitral valve leaflet(s). Trivial to mild mitral valve regurgitation, with posteriorly-directed jet. Tricuspid Valve: The tricuspid  valve is grossly normal. Tricuspid valve regurgitation is trivial. Aortic Valve: The aortic valve is tricuspid. Aortic valve regurgitation is not visualized. Pulmonic Valve: The pulmonic valve was grossly normal. Pulmonic valve regurgitation is trivial. Aorta: The aortic root and ascending aorta are structurally normal, with no evidence of dilitation. Venous: The inferior vena cava is normal in size with less than 50% respiratory variability, suggesting right atrial pressure of 8 mmHg. IAS/Shunts: The interatrial septum was not well visualized.  LEFT VENTRICLE PLAX 2D LVIDd:         4.90 cm          Diastology LVIDs:         3.30 cm         LV e' medial:    6.19 cm/s LV PW:         0.80 cm         LV E/e' medial:  11.4 LV IVS:        0.90 cm         LV e' lateral:   9.13 cm/s LVOT diam:     2.00 cm         LV E/e' lateral: 7.7 LV SV:         60 LV SV Index:   33 LVOT Area:     3.14 cm        3D Volume EF                                LV 3D EF:    Left                                             ventricular                                             ejection                                             fraction by                                             3D volume                                             is 62 %.                                 3D Volume EF:                                3D EF:        62 % RIGHT VENTRICLE RV S prime:     12.00 cm/s TAPSE (M-mode): 1.7 cm LEFT ATRIUM  Index       RIGHT ATRIUM          Index LA diam:        3.30 cm 1.79 cm/m  RA Area:     8.17 cm LA Vol (A2C):   26.6 ml 14.39 ml/m RA Volume:   13.90 ml 7.52 ml/m LA Vol (A4C):   27.4 ml 14.82 ml/m LA Biplane Vol: 28.0 ml 15.15 ml/m  AORTIC VALVE LVOT Vmax:   88.60 cm/s LVOT Vmean:  65.100 cm/s LVOT VTI:    0.192 m  AORTA Ao Root diam: 3.00 cm Ao Asc diam:  2.90 cm MITRAL VALVE MV Area (PHT): 5.27 cm    SHUNTS MV Decel Time: 144 msec    Systemic VTI:  0.19 m MV E velocity: 70.70 cm/s  Systemic Diam: 2.00 cm MV A velocity: 86.00 cm/s MV E/A ratio:  0.82 Lyman Bishop MD Electronically signed by Lyman Bishop MD Signature Date/Time: 01/11/2021/1:15:30 PM    Final    ECHO TEE  Result Date: 01/13/2021    TRANSESOPHOGEAL ECHO REPORT   Patient Name:   ZYA FINKLE Date of Exam: 01/13/2021 Medical Rec #:  [id number]     Height:       62.0 in Accession #:    [id number]    Weight:       184.7 lb Date of Birth:  10/26/1950     BSA:          1.848 m Patient Age:    23 years      BP:           151/80 mmHg Patient Gender: F             HR:           95 bpm. Exam Location:  Inpatient Procedure: 3D Echo,  Transesophageal Echo, Cardiac Doppler and Color Doppler Indications:     Bacteremia  History:         Patient has prior history of Echocardiogram examinations, most                  recent 01/11/2021. Risk Factors:Hypertension and Dyslipidemia.  Sonographer:     Roseanna Rainbow RDCS Referring Phys:  [place] Diagnosing Phys: Oswaldo Milian MD PROCEDURE: After discussion of the risks and benefits of a TEE, an informed consent was obtained from the patient. The transesophogeal probe was passed without difficulty through the esophogus of the patient. Imaged were obtained with the patient in a left lateral decubitus position. Sedation performed by different physician. The patient was monitored while under deep sedation. Anesthestetic sedation was provided intravenously by Anesthesiology: 328mg  of Propofol. The patient developed no complications during the procedure. IMPRESSIONS  1. Left ventricular ejection fraction, by estimation, is 55 to 60%. The left ventricle has normal function.  2. Right ventricular systolic function is normal. The right ventricular size is normal. There is severely elevated pulmonary artery systolic pressure.  3. No left atrial/left atrial appendage thrombus was detected.  4. Right atrial size was mildly dilated.  5. The mitral valve is normal in structure. Mild mitral valve regurgitation.  6. The aortic valve is tricuspid. Aortic valve regurgitation is not visualized.  7. The tricuspid valve is abnormal. Tricuspid valve regurgitation is moderate. 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 The left ventricle has normal function. The left ventricular internal cavity size  was normal in size. Right Ventricle: The right ventricular size is normal. No increase in right ventricular wall thickness. Right ventricular systolic function is normal. There is severely  elevated pulmonary artery systolic pressure. The tricuspid regurgitant velocity is 3.89 m/s, and with an assumed right atrial pressure of 8 mmHg, the estimated right ventricular systolic pressure is 03.4 mmHg. Left Atrium: Left atrial size was normal in size. No left atrial/left atrial appendage thrombus was detected. Right Atrium: Right atrial size was mildly dilated. Pericardium: There is no evidence of pericardial effusion. Mitral Valve: The mitral valve is normal in structure. Mild mitral valve regurgitation. Tricuspid Valve: The tricuspid valve is abnormal. Tricuspid valve regurgitation is moderate. Aortic Valve: The aortic valve is tricuspid. Aortic valve regurgitation is not visualized. Pulmonic Valve: The pulmonic valve was grossly normal. Pulmonic valve regurgitation is trivial. Aorta: The aortic root is normal in size and structure. IAS/Shunts: No atrial level shunt detected by color flow Doppler.  TRICUSPID VALVE TR Peak grad:   60.5 mmHg TR Vmax:        389.00 cm/s Oswaldo Milian MD Electronically signed by Oswaldo Milian MD Signature Date/Time: 01/13/2021/4:14:39 PM    Final    CT IMAGE GUIDED FLUID DRAIN BY CATHETER  Result Date: 01/13/2021 CLINICAL DATA:  Multifocal hepatic abscess with a dominant 6.8 cm component EXAM: CT GUIDED DRAINAGE OF HEPATIC ABSCESS ANESTHESIA/SEDATION: Intravenous Fentanyl 140mg and Versed 230mwere administered as conscious sedation during continuous monitoring of the patient's level of consciousness and physiological / cardiorespiratory status by the radiology RN, with a total moderate sedation time of 17 minutes. PROCEDURE: The procedure, risks, benefits, and alternatives were explained to the patient. Questions regarding the procedure were encouraged and answered. The patient understands and consents to the procedure. Select axial scans through the liver were obtained. The dominant collection was localized and an appropriate skin entry site was determined  and marked. The operative field was prepped with chlorhexidinein a sterile fashion, and a sterile drape was applied covering the operative field. A sterile gown and sterile gloves were used for the procedure. Local anesthesia was provided with 1% Lidocaine. Under CT fluoroscopic guidance with gantry angulation, 18 gauge trocar needle advanced to the collection. Purulent material could be aspirated. Amplatz guidewire advanced easily within the collection, position confirmed on CT. Tract dilated to facilitate placement of a 12 French pigtail drain catheter, formed centrally within the collection. 30 mL purulent aspirate sent for Gram stain, culture and sensitivity. Catheter secured externally with 0 Prolene suture and StatLock and placed to gravity drain bag. The patient tolerated the procedure well. COMPLICATIONS: None immediate FINDINGS: The dominant component in the right lobe was localized. 12 French pigtail drain catheter placed as above from a subcostal approach. A sample of the aspirate sent for Gram stain and culture. IMPRESSION: 1. Technically successful CT-guided hepatic abscess drain catheter placement Electronically Signed   By: D Lucrezia Europe.D.   On: 01/13/2021 07:50   [place]  Result Date: 01/14/2021 If [facility] not [facility], placement could not be confirmed due to current cardiac rhythm.   Labs:  CBC: Recent Labs    01/11/21 0914 01/12/21 0552 01/13/21 0539 01/14/21 0504  WBC 21.7* 21.5* 18.2* 13.7*  HGB 10.6* 10.6* 10.5* 10.0*  HCT 31.8* 32.4* 31.9* 31.0*  PLT 217 221 245 250    COAGS: Recent Labs    01/12/21 0552  INR 1.3*    BMP: Recent Labs    01/11/21 0914 01/12/21 0552 01/13/21  0539 01/14/21 0504 01/15/21 0500  NA 136 138 140 139  --   K 3.8 3.9 3.8 3.8  --   CL 111 111 114* 112*  --   CO2 20* 20* 20* 21*  --   GLUCOSE 98 95 106* 110*  --   BUN _0 --   CALCIUM 7.5* 7.6* 7.7* 7.7*  --   CREATININE 0.94 0.98 0.77 0.76 0.75  GFRNONAA  >60 >60 >60 >60 >60    LIVER FUNCTION TESTS: Recent Labs    01/11/21 0914 01/12/21 0552 01/13/21 0539 01/14/21 0504  BILITOT 0.4 0.5 0.5 0.8  AST 124* 110* 44* 30  ALT 356* 273* 179* 123*  ALKPHOS 124 122 125 149*  PROT 4.9* 4.9* 4.7* 4.7*  ALBUMIN 2.0* 1.9* 1.9* 1.9*    TUMOR MARKERS: No results for input(s): AFPTM, CEA, CA199, CHROMGRNA in the last 8760 hours.  Assessment:  70 y/o F with hepatic abscess s/p percutaneous drainage 6/19 in IR seen today for repeat CT and drain evaluation.  Patient reports flushing drain QD with ~5 cc NS, output is estimated to be about 10 cc QD but she has only emptied the drain once since d/c which yielded 85 cc. No issues with flushing. Output has been serous in character since d/c per patient. No abd pain/n/v/fevers. She is planned to follow up with ID.  Patient imaging today shows residual abscess with drain in good position, given minimal drain output with appropriate flushing will plan for upsize of drain and repeat CT abd/pelvis with IV contrast about 3 weeks after upsize.   Patient was instructed to continue QD flushes with 5 cc NS, record output QD or whenever drain is emptied, dressing changes every 2-3 days or earlier if soiled. She was encouraged to call if she has any questions or concerns about the drain prior to her next appointment. She was also encouraged to keep her follow up appointment with ID.    Electronically Signed: Joaquim Nam PA-C 01/22/2021, 10:41 AM   Please refer to Dr. Pasty Arch attestation of this note for management and plan.

## 2021-01-27 ENCOUNTER — Telehealth: Payer: Self-pay

## 2021-01-27 ENCOUNTER — Telehealth: Payer: Self-pay | Admitting: *Deleted

## 2021-01-27 MED ORDER — APIXABAN 5 MG PO TABS: 5.0000 mg | ORAL_TABLET | Freq: Two times a day (BID) | ORAL | 2 refills | Status: AC

## 2021-01-27 NOTE — Telephone Encounter (Signed)
Patient called with concerns regarding new anticoagulant. She is concerned as 1) she has a procedure on Thursday 01/29/21 where she will be getting a new/larger drain for her hepatic abscess and she is concerned about the risk of bleeding, and 2) she states she has a GI ulcer as well and is concerned about the risk of bleeding from this as well.  She would like to speak with Dr Elinor Parkinson regarding these risks/need for anticoagulant. She is unable to speak with her GI provider as Dr Chales Abrahams [place]. Please advise. Patient is available for phone call at [phone]. Andree Coss, RN

## 2021-01-27 NOTE — Telephone Encounter (Signed)
-----   Message from Rajesh Gupta, MD sent at 01/23/2021  9:40 PM EDT ----- Regarding: FW: update Pt with concern for septic thrombosis Please start Eliquis 5 mg p.o. twice daily.  She has normal renal function. AC would be needed for 3 months.  Fall precautions. Needs to be managed by PCP Dr Nodal. Please send copy of all below to him RG    ----- Message ----- From: Manandhar, Sabina, MD Sent: 01/23/2021   5:44 PM EDT To: Rajesh Gupta, MD Subject: RE: update                                     Thanks for the reply.  Honestly, I have not prescribed anticoagulation on a patient for a long time as I believe it would need to be followed up on. I was messaging you if you could see her for that as she follows you from GI side or even her PCP. I will be following up for her antibiotics from ID side. ----- Message ----- From: Gupta, Rajesh, MD Sent: 01/23/2021   5:22 PM EDT To: Sabina Manandhar, MD Subject: RE: update                                     If there is a concern for septic thrombosis, she would benefit from anticoagulation like Eliquis. You can go ahead and put her on.  RG   ----- Message ----- From: Manandhar, Sabina, MD Sent: 01/23/2021  10:27 AM EDT To: Rajesh Gupta, MD Subject: update                                         Hello,   I just wanted to inform you regarding our mutual patient. I got a message from [facility] when she was seen by them for drain evaluation for hepatic abscess. There is a concern for septic thrombosis and I am letting you know if you would like to placerher on Anticoagulation.   Thanks.   Sabina M     

## 2021-01-27 NOTE — Telephone Encounter (Signed)
Prescription sent to pharmacy, pt aware and note sent to pharmacy.

## 2021-01-27 NOTE — Telephone Encounter (Signed)
-----   Message from Lynann Bologna, MD sent at 01/23/2021  9:40 PM EDT ----- Regarding: FW: update Pt with concern for septic thrombosis Please start Eliquis 5 mg p.o. twice daily.  She has normal renal function. AC would be needed for 3 months.  Fall precautions. Needs to be managed by PCP Dr Etter Sjogren. Please send copy of all below to him RG    ----- Message ----- From: Odette Fraction, MD Sent: 01/23/2021   5:44 PM EDT To: Lynann Bologna, MD Subject: RE: update                                     Thanks for the reply.  Honestly, I have not prescribed anticoagulation on a patient for a long time as I believe it would need to be followed up on. I was messaging you if you could see her for that as she follows you from GI side or even her PCP. I will be following up for her antibiotics from ID side. ----- Message ----- From: Lynann Bologna, MD Sent: 01/23/2021   5:22 PM EDT To: Odette Fraction, MD Subject: RE: update                                     If there is a concern for septic thrombosis, she would benefit from anticoagulation like Eliquis. You can go ahead and put her on.  RG   ----- Message ----- From: Odette Fraction, MD Sent: 01/23/2021  10:27 AM EDT To: Lynann Bologna, MD Subject: update                                         Hello,   I just wanted to inform you regarding our mutual patient. I got a message from [facility] when she was seen by them for drain evaluation for hepatic abscess. There is a concern for septic thrombosis and I am letting you know if you would like to placerher on Anticoagulation.   Thanks.   Manuela Schwartz

## 2021-01-27 NOTE — Telephone Encounter (Signed)
Pt aware, script sent to pharmacy and copy sent to pts PCP.

## 2021-01-29 ENCOUNTER — Other Ambulatory Visit (HOSPITAL_COMMUNITY): Payer: Self-pay | Admitting: Physician Assistant

## 2021-01-29 ENCOUNTER — Ambulatory Visit (HOSPITAL_COMMUNITY)
Admission: RE | Admit: 2021-01-29 | Discharge: 2021-01-29 | Disposition: A | Discharge status: Home / Self Care | Payer: 59 | Source: Ambulatory Visit | Attending: Physician Assistant | Admitting: Physician Assistant

## 2021-01-29 ENCOUNTER — Other Ambulatory Visit: Payer: Self-pay

## 2021-01-29 DIAGNOSIS — K750 Abscess of liver: Secondary | ICD-10-CM | POA: Diagnosis not present

## 2021-01-29 DIAGNOSIS — Z4803 Encounter for change or removal of drains: Secondary | ICD-10-CM | POA: Diagnosis not present

## 2021-01-29 HISTORY — PX: IR CATHETER TUBE CHANGE: IMG717

## 2021-01-29 MED ORDER — LIDOCAINE HCL 1 % IJ SOLN
INTRAMUSCULAR | Status: AC
  Filled 2021-01-29: qty 20

## 2021-01-29 MED ORDER — IOHEXOL 300 MG/ML  SOLN
50.0000 mL | Freq: Once | INTRAMUSCULAR | Status: AC | PRN
  Administered 2021-01-29: 20 mL

## 2021-01-29 MED ORDER — LIDOCAINE HCL (PF) 1 % IJ SOLN
INTRAMUSCULAR | Status: DC | PRN
  Administered 2021-01-29: 10 mL

## 2021-01-29 NOTE — Procedures (Signed)
Interventional Radiology Procedure Note  Procedure: Image guided drain exchange/upsize hepatic abscess.  41F pigtail drain.  Complications: None  Recommendations: - Routine drain care,  record output - OK to start/restart anticoagulation - follow up on schedule in clinic  Signed,  Yvone Neu. Loreta Ave, DO

## 2021-02-04 ENCOUNTER — Other Ambulatory Visit: Payer: Self-pay | Admitting: Infectious Diseases

## 2021-02-04 ENCOUNTER — Encounter: Payer: Self-pay | Admitting: Infectious Diseases

## 2021-02-04 ENCOUNTER — Other Ambulatory Visit: Payer: Self-pay

## 2021-02-04 ENCOUNTER — Telehealth: Payer: Self-pay | Admitting: *Deleted

## 2021-02-04 ENCOUNTER — Ambulatory Visit (INDEPENDENT_AMBULATORY_CARE_PROVIDER_SITE_OTHER): Payer: 59 | Admitting: Infectious Diseases

## 2021-02-04 VITALS — BP 154/82 | HR 98 | Temp 98.4°F | Ht 62.0 in | Wt 169.0 lb

## 2021-02-04 DIAGNOSIS — A0472 Enterocolitis due to Clostridium difficile, not specified as recurrent: Secondary | ICD-10-CM | POA: Diagnosis not present

## 2021-02-04 DIAGNOSIS — R7881 Bacteremia: Secondary | ICD-10-CM

## 2021-02-04 DIAGNOSIS — Z5181 Encounter for therapeutic drug level monitoring: Secondary | ICD-10-CM | POA: Diagnosis not present

## 2021-02-04 DIAGNOSIS — K750 Abscess of liver: Secondary | ICD-10-CM

## 2021-02-04 MED ORDER — VANCOMYCIN HCL 125 MG PO CAPS: 125.0000 mg | ORAL_CAPSULE | Freq: Two times a day (BID) | ORAL | 0 refills | Status: DC

## 2021-02-04 MED ORDER — METRONIDAZOLE 500 MG PO TABS: 500.0000 mg | ORAL_TABLET | Freq: Two times a day (BID) | ORAL | 0 refills | Status: DC

## 2021-02-04 NOTE — Telephone Encounter (Signed)
Per Dr Elinor Parkinson, relayed verbal order to [facility] for new end date of 7/31 for IV penicillin. Order repeated and verified.  Andree Coss, RN

## 2021-02-04 NOTE — Progress Notes (Signed)
[facility]                                                             [place], [place], [place], [address]                                                                  Phn. [phone]; Fax: [phone]                                                                             Date: 02/04/21  Reason for Follow up: HFU for liver abscess  Assessment Problem List Items Addressed This Visit       Digestive   Liver abscess   C. difficile diarrhea   Relevant Medications   metroNIDAZOLE (FLAGYL) 500 MG tablet   vancomycin (VANCOCIN) 125 MG capsule     Other   Bacteremia - Primary   Medication monitoring encounter    Streptococcus intermedius bacteremia Multiple hepatic abscesses -TTE is negative for vegetations - S/pCT-guided drain on 6/19.  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 difficile diarrhea, first episode: resolved, s/p 10 days of Vancomycin therapeutic dosing   Medication Monitoring  01/28/21 cr 0.89 WBC 4.6, hb 11.6, plts 387  Plan Continue IV penicillin and PO metronidazole as is for 2 more weeks until 02/22/21 Continue PO vancomycin for C diff ppx Fu in 2 weeks after CT abd/pelvis scheduled on 7/28 with one of my partners for possible transition to PO Augmentin   All questions and concerns were discussed and addressed. Patient verbalized understanding of the plan. ____________________________________________________________________________________________________________________ HPI/Interval Events Here for HFU for hepatic abscess/C diff diarrhea. Patient was discharged from the hospital on 01/15/21. She has been getting IV penicillin and PO metronidazole without any issues. She has also completed 10 days course of PO Vancomycin for C diff and is currently  taking Vancomycin 16m PO BID for ppx while she is on antibiotics for hepatic abscess. Diarrhea is resolved. Denies fevers, chills and sweats. Denies nausea, vomiting, abdominal pain. Had a repeat CT abd/pelvis by IR in 6/30 with persistent dominant liver abscess  with additional sites of abscess. She had upsize/exchange of hepatic drain on 7/7. She has been also started on Apixaban for septic thrombosis of rt sided hepatic veins by GI Dr GLyndel Safewhich she started taking a couple of days ago. Discussed with her to follow up in 2 weeks with one of my partners as I will be away after her CT abd/pelvis in 7/28.   ROS: 12 point ROS done with pertinent positives and negatives listed above  Past Medical History:  Diagnosis Date   Allergy  Asthma    when allergies flare   GERD (gastroesophageal reflux disease)    Hypertension    Past Surgical History:  Procedure Laterality Date   IR CATHETER TUBE CHANGE  01/29/2021   IR RADIOLOGIST EVAL & MGMT  01/22/2021   PARTIAL HYSTERECTOMY  1986   TEE WITHOUT CARDIOVERSION N/A 01/13/2021   Procedure: TRANSESOPHAGEAL ECHOCARDIOGRAM (TEE);  Surgeon: Donato Heinz, MD;  Location: [facility] ENDOSCOPY;  Service: Cardiovascular;  Laterality: N/A;   TONSILLECTOMY AND ADENOIDECTOMY Bilateral    age 70   Current Outpatient Medications on File Prior to Visit  Medication Sig Dispense Refill   albuterol (VENTOLIN HFA) 108 (90 Base) MCG/ACT inhaler Inhale 1 puff into the lungs every 6 (six) hours as needed for wheezing or shortness of breath.     apixaban (ELIQUIS) 5 MG TABS tablet Take 1 tablet (5 mg total) by mouth 2 (two) times daily. 60 tablet 2   atenolol (TENORMIN) 25 MG tablet Take 25 mg by mouth daily.     cetirizine (ZYRTEC) 10 MG tablet Take 10 mg by mouth daily.     EPINEPHrine 0.3 mg/0.3 mL IJ SOAJ injection Inject 0.3 mg into the muscle once as needed (if patient exhibits significant signs and symptoms of allergic reaction.). 1 each 1   hydrochlorothiazide  (HYDRODIURIL) 12.5 MG tablet Take 12.5 mg by mouth daily.     montelukast (SINGULAIR) 10 MG tablet Take 10 mg by mouth daily as needed (allergy).     ondansetron (ZOFRAN) 8 MG tablet Take 8 mg by mouth every 8 (eight) hours as needed.     penicillin G IVPB Inject 24 Million Units into the vein daily for 24 days. As a continuous infusion. Indication:  Liver abscess First Dose: No Last Day of Therapy:  02/08/2021 Labs - Once weekly:  CBC/D and BMP, Labs - Every other week:  ESR and CRP Method of administration: Elastomeric (Continuous infusion) Method of administration may be changed at the discretion of home infusion pharmacist based upon assessment of the patient and/or caregiver's ability to self-administer the medication ordered. 24 Units 0   Multiple Vitamin (MULTIVITAMIN ADULT PO) Take 1 tablet by mouth daily. (Patient not taking: Reported on 02/04/2021)     Current Facility-Administered Medications on File Prior to Visit  Medication Dose Route Frequency Provider Last Rate Last Admin   0.9 %  sodium chloride infusion  500 mL Intravenous Once Jackquline Denmark, MD       No Known Allergies  Social History   Socioeconomic History   Marital status: Married    Spouse name: Not on file   Number of children: Not on file   Years of education: Not on file   Highest education level: Not on file  Occupational History   Not on file  Tobacco Use   Smoking status: Never   Smokeless tobacco: Never  Vaping Use   Vaping Use: Never used  Substance and Sexual Activity   Alcohol use: Not Currently   Drug use: Never   Sexual activity: Not on file  Other Topics Concern   Not on file  Social History Narrative   Not on file   Social Determinants of Health   Financial Resource Strain: Not on file  Food Insecurity: Not on file  Transportation Needs: Not on file  Physical Activity: Not on file  Stress: Not on file  Social Connections: Not on file  Intimate Partner Violence: Not on file     Vitals BP (!) 154/82  Pulse 98   Temp 98.4 F (36.9 C) (Oral)   Ht _0  (1.575 m)   Wt 169 lb (76.7 kg)   SpO2 98%   BMI 30.91 kg/m    Examination  General - not in acute distress, comfortably sitting in chair HEENT -  no pallor and no icterus Chest - b/l clear air entry, no additional sounds CVS- Normal s1s2, RRR Abdomen - Soft, Non tender , non distended, Rt pigtail drain site is bandaged, has minimal soaking ( dressing has not been changed for 1 week). Drainage bulb with 30 cc of serosanguinous fluid.  Ext- no pedal edema Neuro: grossly normal Back - WNL Psych : calm and cooperative  Recent labs CBC Latest Ref Rng & Units 01/14/2021 01/13/2021 01/12/2021  WBC 4.0 - 10.5 K/uL 13.7(H) 18.2(H) 21.5(H)  Hemoglobin 12.0 - 15.0 g/dL 10.0(L) 10.5(L) 10.6(L)  Hematocrit 36.0 - 46.0 % 31.0(L) 31.9(L) 32.4(L)  Platelets 150 - 400 K/uL 250 245 221   CMP Latest Ref Rng & Units 01/15/2021 01/14/2021 01/13/2021  Glucose 70 - 99 mg/dL - 110(H) 106(H)  BUN 8 - 23 mg/dL - 9 11  Creatinine 0.44 - 1.00 mg/dL 0.75 0.76 0.77  Sodium 135 - 145 mmol/L - 139 140  Potassium 3.5 - 5.1 mmol/L - 3.8 3.8  Chloride 98 - 111 mmol/L - 112(H) 114(H)  CO2 22 - 32 mmol/L - 21(L) 20(L)  Calcium 8.9 - 10.3 mg/dL - 7.7(L) 7.7(L)  Total Protein 6.5 - 8.1 g/dL - 4.7(L) 4.7(L)  Total Bilirubin 0.3 - 1.2 mg/dL - 0.8 0.5  Alkaline Phos 38 - 126 U/L - 149(H) 125  AST 15 - 41 U/L - 30 44(H)  ALT 0 - 44 U/L - 123(H) 179(H)     Pertinent Microbiology Results for orders placed or performed during the hospital encounter of 01/08/21  Culture, blood (routine x 2)     Status: None   Collection Time: 01/09/21 10:05 AM   Specimen: BLOOD LEFT HAND  Result Value Ref Range Status   Specimen Description   Final    BLOOD LEFT HAND Performed at [facility] [address], [place], [place] [address]    Special Requests   Final    BOTTLES DRAWN AEROBIC ONLY Blood Culture adequate  volume Performed at [facility] [address] [place], [place] [address]    Culture   Final    NO GROWTH 5 DAYS Performed at [facility], [place] [address], [place], [place] [address]    Report Status 01/14/2021 FINAL  Final  Culture, blood (routine x 2)     Status: None   Collection Time: 01/09/21 10:12 AM   Specimen: BLOOD  Result Value Ref Range Status   Specimen Description   Final    BLOOD RIGHT ANTECUBITAL Performed at [facility] [address] [place], [place] [address]    Special Requests   Final    BOTTLES DRAWN AEROBIC AND ANAEROBIC Blood Culture adequate volume Performed at [facility] [address] [place], [place] [address]    Culture   Final    NO GROWTH 5 DAYS Performed at [facility], [place] [address], [place], [place] [address]    Report Status 01/14/2021 FINAL  Final  MRSA PCR Screening     Status: None   Collection Time: 01/09/21  4:08 PM  Result Value Ref Range Status   MRSA by PCR NEGATIVE NEGATIVE Final  Comment:        The GeneXpert MRSA Assay (FDA approved for NASAL specimens only), is one component of a comprehensive MRSA colonization surveillance program. It is not intended to diagnose MRSA infection nor to guide or monitor treatment for MRSA infections. Performed at [facility], [place] [address], [place], [place] [address]   Gastrointestinal Panel by PCR , Stool     Status: None   Collection Time: 01/11/21  3:25 AM   Specimen: STOOL  Result Value Ref Range Status   Campylobacter species NOT DETECTED NOT DETECTED Final   Plesimonas shigelloides NOT DETECTED NOT DETECTED Final   Salmonella species NOT DETECTED NOT DETECTED Final   Yersinia enterocolitica NOT DETECTED NOT DETECTED Final   Vibrio species NOT DETECTED NOT DETECTED Final   Vibrio cholerae NOT DETECTED NOT DETECTED Final   Enteroaggregative E coli (EAEC) NOT DETECTED NOT DETECTED  Final   Enteropathogenic E coli (EPEC) NOT DETECTED NOT DETECTED Final   Enterotoxigenic E coli (ETEC) NOT DETECTED NOT DETECTED Final   Shiga like toxin producing E coli (STEC) NOT DETECTED NOT DETECTED Final   Shigella/Enteroinvasive E coli (EIEC) NOT DETECTED NOT DETECTED Final   Cryptosporidium NOT DETECTED NOT DETECTED Final   Cyclospora cayetanensis NOT DETECTED NOT DETECTED Final   Entamoeba histolytica NOT DETECTED NOT DETECTED Final   Giardia lamblia NOT DETECTED NOT DETECTED Final   Adenovirus F40/41 NOT DETECTED NOT DETECTED Final   Astrovirus NOT DETECTED NOT DETECTED Final   Norovirus GI/GII NOT DETECTED NOT DETECTED Final   Rotavirus A NOT DETECTED NOT DETECTED Final   Sapovirus (I, II, IV, and V) NOT DETECTED NOT DETECTED Final    Comment: Performed at [facility], [place] [place], [place] [address]  Aerobic/Anaerobic Culture w Gram Stain (surgical/deep wound)     Status: None   Collection Time: 01/11/21  2:14 PM   Specimen: Abscess  Result Value Ref Range Status   Specimen Description   Final    ABSCESS Performed at [name], [place] [address] [place], [place] [address]    Special Requests CT HEPATIC DRAIN  Final   Gram Stain   Final    ABUNDANT WBC PRESENT, PREDOMINANTLY PMN ABUNDANT GRAM POSITIVE COCCI    Culture   Final    FEW STREPTOCOCCUS INTERMEDIUS NO ANAEROBES ISOLATED Performed at [facility], [place] [address], [place], [place] [address]    Report Status 01/17/2021 FINAL  Final   Organism ID, Bacteria STREPTOCOCCUS INTERMEDIUS  Final      Susceptibility   Streptococcus intermedius - MIC*    PENICILLIN <=0.06 SENSITIVE Sensitive     CEFTRIAXONE 0.25 SENSITIVE Sensitive     ERYTHROMYCIN <=0.12 SENSITIVE Sensitive     LEVOFLOXACIN 0.5 SENSITIVE Sensitive     VANCOMYCIN 0.5 SENSITIVE Sensitive     * FEW STREPTOCOCCUS INTERMEDIUS     Pertinent Imaging CT abdomen/pelvis 01/22/21 IMPRESSION: Unchanged  position of pigtail drainage catheter within the dominant liver abscess. The size is relatively unchanged since the comparison of 1 week prior. Similar size of the additional hypodense/hypoenhancing foci of the bilateral liver, compatible with additional sites of abscess.   The timing of the contrast bolus on the current CT suggests septic thrombosis of the right-sided hepatic veins, contributing to the perfusion defect of the right liver.   Diverticular disease of the sigmoid colon, potentially etiology of the liver abscess.     All pertinent labs/Imagings/notes reviewed. All pertinent plain films and  CT images have been personally visualized and interpreted; radiology reports have been reviewed. Decision making incorporated into the Impression / Recommendations.  I have spent a total of 60 minutes of face-to-face and non-face-to-face time, excluding clinical staff time, preparing to see patient, ordering tests and/or medications, and provide counseling the patient    Electronically signed by:  Rosiland Oz, MD Infectious Disease Physician [facility] [address] [place], [place] [address] Phone: [phone]  Fax: [phone]

## 2021-02-10 ENCOUNTER — Telehealth: Payer: Self-pay

## 2021-02-10 NOTE — Telephone Encounter (Signed)
Fax received from [place] and [place] for short term disability. Provider isn't in the office for 2 weeks and unable to filll out form. RN contacted the benefits administrator to determine if forms could be completed by the patient's PCP since she won't be seen until 29th by different provider.   Awaiting return call.  Aneeka Bowden Loyola Mast, RN

## 2021-02-13 NOTE — Telephone Encounter (Signed)
Patient called to follow up on her Short Term Disabilty forms. Patient has been advised we are waiting for a response from benefits administrator in regards of forms being able to be filled out by her pcp. Patient stated that she has not seen her pcp since her hospital discharge. I have also let patient know we can discuss her forms with Dr. Luciana Axe when she follow up on the 29th to see if he is willing to fill the forms

## 2021-02-13 NOTE — Telephone Encounter (Signed)
Left 2nd voicemail with benefits administrator requesting call back to discuss paperwork for patient. Forms left in triage in case call is returned.   Shilo Philipson Loyola Mast, RN

## 2021-02-19 ENCOUNTER — Ambulatory Visit
Admission: RE | Admit: 2021-02-19 | Discharge: 2021-02-19 | Disposition: A | Discharge status: Home / Self Care | Payer: 59 | Source: Ambulatory Visit | Attending: Physician Assistant | Admitting: Physician Assistant

## 2021-02-19 ENCOUNTER — Ambulatory Visit
Admission: RE | Admit: 2021-02-19 | Discharge: 2021-02-19 | Disposition: A | Discharge status: Home / Self Care | Payer: 59 | Source: Ambulatory Visit | Attending: Infectious Diseases | Admitting: Infectious Diseases

## 2021-02-19 ENCOUNTER — Other Ambulatory Visit: Payer: Self-pay

## 2021-02-19 DIAGNOSIS — K750 Abscess of liver: Secondary | ICD-10-CM

## 2021-02-19 HISTORY — PX: IR RADIOLOGIST EVAL & MGMT: IMG5224

## 2021-02-19 MED ORDER — IOPAMIDOL (ISOVUE-370) INJECTION 76%
80.0000 mL | Freq: Once | INTRAVENOUS | Status: AC | PRN
  Administered 2021-02-19: 80 mL via INTRAVENOUS

## 2021-02-19 NOTE — Progress Notes (Signed)
Referring Physician(s): Manandhar,S  Chief Complaint: The patient is seen in follow up today s/p drainage of right hepatic lobe abscess on 01/11/21  History of present illness:  Ms. Brenda Morse is a 70 year old female with prior history of multifocal hepatic abscess as well as C. difficile diarrhea.  She underwent image guided drainage of the dominant right hepatic lobe component on 01/11/2021.  Drain fluid cultures grew Streptococcus intermedius.  Due to minimal output from drain and persistent abscess on imaging she subsequently underwent drain exchange and upsizing to 14 [place] on 01/29/2021.  She was also started on Eliquis for septic thrombosis of right sided hepatic veins by GI Dr.Gupta.  She has been on antibiotic therapy.  She presents again today for follow-up CT abdomen and pelvis and evaluation of hepatic abscess drain.  She currently denies fever, headache, chest pain, dyspnea, cough, abdominal pain, back pain, nausea, vomiting or bleeding.  She does have some occasional loose stools.  She is currently flushing her drain once a day with 10 cc saline, output has been minimal.  Past Medical History:  Diagnosis Date   Allergy    Asthma    when allergies flare   GERD (gastroesophageal reflux disease)    Hypertension     Past Surgical History:  Procedure Laterality Date   IR CATHETER TUBE CHANGE  01/29/2021   IR RADIOLOGIST EVAL & MGMT  01/22/2021   PARTIAL HYSTERECTOMY  1986   TEE WITHOUT CARDIOVERSION N/A 01/13/2021   Procedure: TRANSESOPHAGEAL ECHOCARDIOGRAM (TEE);  Surgeon: Little Ishikawa, MD;  Location: [facility] ENDOSCOPY;  Service: Cardiovascular;  Laterality: N/A;   TONSILLECTOMY AND ADENOIDECTOMY Bilateral    age 3    Allergies: Patient has no known allergies.  Medications: Prior to Admission medications   Medication Sig Start Date End Date Taking? Authorizing Provider  albuterol (VENTOLIN HFA) 108 (90 Base) MCG/ACT inhaler Inhale 1 puff into the lungs every 6 (six) hours  as needed for wheezing or shortness of breath. 01/05/21   [provider]  apixaban (ELIQUIS) 5 MG TABS tablet Take 1 tablet (5 mg total) by mouth 2 (two) times daily. 01/27/21   Lynann Bologna, MD  atenolol (TENORMIN) 25 MG tablet Take 25 mg by mouth daily. 07/10/20   [provider]  cetirizine (ZYRTEC) 10 MG tablet Take 10 mg by mouth daily.    [provider]  EPINEPHrine 0.3 mg/0.3 mL IJ SOAJ injection Inject 0.3 mg into the muscle once as needed (if patient exhibits significant signs and symptoms of allergic reaction.). 01/15/21   Leatha Gilding, MD  hydrochlorothiazide (HYDRODIURIL) 12.5 MG tablet Take 12.5 mg by mouth daily. 07/10/20   [provider]  metroNIDAZOLE (FLAGYL) 500 MG tablet Take 1 tablet (500 mg total) by mouth 2 (two) times daily for 24 days. 02/04/21 02/28/21  Odette Fraction, MD  montelukast (SINGULAIR) 10 MG tablet Take 10 mg by mouth daily as needed (allergy). 07/10/20   [provider]  Multiple Vitamin (MULTIVITAMIN ADULT PO) Take 1 tablet by mouth daily. Patient not taking: Reported on 02/04/2021    [provider]  ondansetron (ZOFRAN) 8 MG tablet Take 8 mg by mouth every 8 (eight) hours as needed. 01/22/21   [provider]  vancomycin (VANCOCIN) 125 MG capsule Take 1 capsule (125 mg total) by mouth in the morning and at bedtime for 24 days. 02/04/21 02/28/21  Odette Fraction, MD     Family History  Problem Relation Age of Onset   Colon cancer Brother 29  Rectal cancer Brother    Esophageal cancer Neg Hx    Stomach cancer Neg Hx     Social History   Socioeconomic History   Marital status: Married    Spouse name: Not on file   Number of children: Not on file   Years of education: Not on file   Highest education level: Not on file  Occupational History   Not on file  Tobacco Use   Smoking status: Never   Smokeless tobacco: Never  Vaping Use   Vaping Use: Never used  Substance and Sexual  Activity   Alcohol use: Not Currently   Drug use: Never   Sexual activity: Not on file  Other Topics Concern   Not on file  Social History Narrative   Not on file   Social Determinants of Health   Financial Resource Strain: Not on file  Food Insecurity: Not on file  Transportation Needs: Not on file  Physical Activity: Not on file  Stress: Not on file  Social Connections: Not on file       Physical Exam awake, alert.  Mid abdominal /hepatic drain intact, insertion site okay, minimal tenderness, small amount of turbid beige-colored fluid in JP bulb.  Imaging: No results found.  Labs:  CBC: Recent Labs    01/11/21 0914 01/12/21 0552 01/13/21 0539 01/14/21 0504  WBC 21.7* 21.5* 18.2* 13.7*  HGB 10.6* 10.6* 10.5* 10.0*  HCT 31.8* 32.4* 31.9* 31.0*  PLT 217 221 245 250    COAGS: Recent Labs    01/12/21 0552  INR 1.3*    BMP: Recent Labs    01/11/21 0914 01/12/21 0552 01/13/21 0539 01/14/21 0504 01/15/21 0500  NA 136 138 140 139  --   K 3.8 3.9 3.8 3.8  --   CL 111 111 114* 112*  --   CO2 20* 20* 20* 21*  --   GLUCOSE 98 95 106* 110*  --   BUN 19 14 11 9   --   CALCIUM 7.5* 7.6* 7.7* 7.7*  --   CREATININE 0.94 0.98 0.77 0.76 0.75  GFRNONAA >60 >60 >60 >60 >60    LIVER FUNCTION TESTS: Recent Labs    01/11/21 0914 01/12/21 0552 01/13/21 0539 01/14/21 0504  BILITOT 0.4 0.5 0.5 0.8  AST 124* 110* 44* 30  ALT 356* 273* 179* 123*  ALKPHOS 124 122 125 149*  PROT 4.9* 4.9* 4.7* 4.7*  ALBUMIN 2.0* 1.9* 1.9* 1.9*    Assessment: 70 year old female with prior history of multifocal hepatic abscess as well as C. difficile diarrhea.  She underwent image guided drainage of the dominant right hepatic lobe component on 01/11/2021.  Drain fluid cultures grew Streptococcus intermedius.  Due to minimal output from drain and persistent abscess on imaging she subsequently underwent drain exchange and upsizing to 14 01/13/2021 on 01/29/2021.  She was also started on  Eliquis for septic thrombosis of right sided hepatic veins by GI Dr.Gupta.  She has been on antibiotic therapy.  She presents again today for follow-up CT abdomen and pelvis and evaluation of hepatic abscess drain.  She currently denies fever, headache, chest pain, dyspnea, cough, abdominal pain, back pain, nausea, vomiting or bleeding.  She does have some occasional loose stools.  She is currently flushing her drain once a day with 10 cc saline, output has been minimal.  Preliminary results from follow-up CT abdomen pelvis today reveals near resolution of hepatic abscesses.  Images were reviewed by Dr. 04/01/2021 and decision made to remove drain.  Hepatic drain removed in its entirety without immediate complications.  Gauze dressing applied over site.  Site care instructions reviewed with patient.   Signed: D. Jeananne Rama, PA-C 02/19/2021, 12:50 PM   Please refer to Dr. Antonietta Jewel attestation of this note for management and plan.      Patient ID: Brenda Morse, female   DOB: 1950-11-03, 70 y.o.   MRN: [id number]

## 2021-02-20 ENCOUNTER — Ambulatory Visit (INDEPENDENT_AMBULATORY_CARE_PROVIDER_SITE_OTHER): Payer: 59 | Admitting: Internal Medicine

## 2021-02-20 ENCOUNTER — Other Ambulatory Visit: Payer: Self-pay

## 2021-02-20 ENCOUNTER — Encounter: Payer: Self-pay | Admitting: *Deleted

## 2021-02-20 ENCOUNTER — Telehealth: Payer: Self-pay

## 2021-02-20 VITALS — BP 123/76 | HR 95 | Temp 97.9°F | Wt 168.0 lb

## 2021-02-20 DIAGNOSIS — K750 Abscess of liver: Secondary | ICD-10-CM

## 2021-02-20 DIAGNOSIS — A0472 Enterocolitis due to Clostridium difficile, not specified as recurrent: Secondary | ICD-10-CM | POA: Diagnosis not present

## 2021-02-20 DIAGNOSIS — R7881 Bacteremia: Secondary | ICD-10-CM | POA: Diagnosis not present

## 2021-02-20 NOTE — Progress Notes (Signed)
   Subjective:    Patient ID: Brenda Morse, female    DOB: [birth year], 70 y.o.   MRN: [id number]  HPI Here for hsfu She was seen in the hospital in June with fever, abdominal pain and found a liver abscess.  Blood cultures positive for Strep intermedius.  She also developed diarrhea and diagnosed with C diff.  She continued with IV penicillin and oral metronidazole and rescanned with some residual fluid.  She had the catheter upsized by IR and continued on antibiotics.  She was seen by IR yesterday with resolution of the fluid collection and and had the catheter removed.  She is doing well.  Diarrhea improved.  She has been on oral vancomycin for the duration of antibiotics as well.  She has a form for short term disability.     Review of Systems  Constitutional:  Negative for chills and fever.  Gastrointestinal:  Negative for nausea.  Skin:  Negative for rash.      Objective:   Physical Exam Eyes:     General: No scleral icterus. Skin:    Findings: No rash.  Neurological:     Mental Status: She is alert.  Psychiatric:        Mood and Affect: Mood normal.    SH: no tobacco      Assessment & Plan:

## 2021-02-20 NOTE — Assessment & Plan Note (Signed)
Stable.  Will continue with the 4 days of oral vancomycin and stop.

## 2021-02-20 NOTE — Telephone Encounter (Signed)
Spoke to [name] at [facility] to inform her of verbal orders per Dr.Comer - IV abx end today (02/20/2021) and PICC line to be pulled on 02/21/2021. [name] repeated verbal orders back to me and verbalized her understanding.    Brenda Morse Brenda Morse, CMA

## 2021-02-20 NOTE — Assessment & Plan Note (Signed)
Repeat blood cultures remained negative.

## 2021-02-20 NOTE — Assessment & Plan Note (Signed)
Now resolved and will stop antibiotics  Short term disability filled out, had not been previously discussed with me Follow up as needed

## 2021-04-21 ENCOUNTER — Other Ambulatory Visit: Payer: Self-pay | Admitting: Gastroenterology

## 2021-04-22 ENCOUNTER — Other Ambulatory Visit: Payer: Self-pay

## 2021-04-22 ENCOUNTER — Ambulatory Visit (INDEPENDENT_AMBULATORY_CARE_PROVIDER_SITE_OTHER): Payer: 59 | Admitting: Gastroenterology

## 2021-04-22 ENCOUNTER — Encounter: Payer: Self-pay | Admitting: Gastroenterology

## 2021-04-22 VITALS — BP 130/78 | HR 87 | Ht 62.0 in | Wt 177.1 lb

## 2021-04-22 DIAGNOSIS — K750 Abscess of liver: Secondary | ICD-10-CM | POA: Diagnosis not present

## 2021-04-22 NOTE — Progress Notes (Signed)
Chief Complaint: FU  Referring Provider:  Leane Call, PA-C      ASSESSMENT AND PLAN;   #1. Liver abscess (resolved). H/O C. difficile colitis as below.  #2. Moderate pancolonic diverticulosis.  Plan: -Finish eliquis and then stop. -FU as needed.  Available for any further GI needs.  She knows how to get in touch with [place].  She has all her contact numbers. -Avoid antibiotics.    HPI:    Brenda Morse is a 70 y.o. female   For FU visit.  Adm to [facility] June 2022 d/t Fever, abdo pain and found to have a liver abscess with + BC for strep intermedius req A/Bs, IR drainage complicated by C. difficile colitis req vancomycin. Rpt CT 7/29 showed complete resolution of abscess.  There was concern for septic emboli.  Although TEE was neg for vegetations, it was decided to give her Eliquis x 3 months.  Diarrhea has resolved.  No GI complaints.  She is back to work.  Has few days of Eliquis left.     Past GI procedures:  Colonoscopy 09/2020 d/t FH of colon cancer - Moderate pancolonic diverticulosis. - A single non-bleeding incidental colonic AVM. - Non-bleeding internal hemorrhoids. - The examination was otherwise normal on direct and retroflexion views. - Rpt routine colonoscopy is not recommended d/t age.  Past Medical History:  Diagnosis Date   Allergy    Asthma    when allergies flare   Carotid stenosis, right    ETD (Eustachian tube dysfunction), bilateral    GERD (gastroesophageal reflux disease)    Heart palpitations    Hyperlipidemia    Hypertension     Past Surgical History:  Procedure Laterality Date   COLONOSCOPY  11/12/2015   Moderate sigmoid diverticulosis. Otherwise normal colonoscopy   IR CATHETER TUBE CHANGE  01/29/2021   IR RADIOLOGIST EVAL & MGMT  01/22/2021   IR RADIOLOGIST EVAL & MGMT  02/19/2021   PARTIAL HYSTERECTOMY  1986   TEE WITHOUT CARDIOVERSION N/A 01/13/2021   Procedure: TRANSESOPHAGEAL ECHOCARDIOGRAM (TEE);  Surgeon: Little Ishikawa, MD;  Location: [facility] ENDOSCOPY;  Service: Cardiovascular;  Laterality: N/A;   TONSILLECTOMY AND ADENOIDECTOMY Bilateral    age 10    Family History  Problem Relation Age of Onset   Colon cancer Brother 42   Rectal cancer Brother    Esophageal cancer Neg Hx    Stomach cancer Neg Hx     Social History   Tobacco Use   Smoking status: Never   Smokeless tobacco: Never  Vaping Use   Vaping Use: Never used  Substance Use Topics   Alcohol use: Not Currently   Drug use: Never    Current Outpatient Medications  Medication Sig Dispense Refill   albuterol (VENTOLIN HFA) 108 (90 Base) MCG/ACT inhaler Inhale 1 puff into the lungs every 6 (six) hours as needed for wheezing or shortness of breath.     apixaban (ELIQUIS) 5 MG TABS tablet Take 1 tablet (5 mg total) by mouth 2 (two) times daily. 60 tablet 2   atenolol (TENORMIN) 25 MG tablet Take 25 mg by mouth daily.     cetirizine (ZYRTEC) 10 MG tablet Take 10 mg by mouth daily.     EPINEPHrine 0.3 mg/0.3 mL IJ SOAJ injection Inject 0.3 mg into the muscle once as needed (if patient exhibits significant signs and symptoms of allergic reaction.). 1 each 1   hydrochlorothiazide (HYDRODIURIL) 12.5 MG tablet Take 12.5 mg by mouth daily.  montelukast (SINGULAIR) 10 MG tablet Take 10 mg by mouth daily as needed (allergy).     Multiple Vitamin (MULTIVITAMIN ADULT PO) Take 1 tablet by mouth daily.     ondansetron (ZOFRAN) 8 MG tablet Take 8 mg by mouth every 8 (eight) hours as needed.     No current facility-administered medications for this visit.    No Known Allergies  Review of Systems:  neg     Physical Exam:    BP 130/78   Pulse 87   Ht 5\' 2"  (1.575 m)   Wt 177 lb 2 oz (80.3 kg)   SpO2 98%   BMI 32.40 kg/m  Wt Readings from Last 3 Encounters:  04/22/21 177 lb 2 oz (80.3 kg)  02/20/21 168 lb (76.2 kg)  02/04/21 169 lb (76.7 kg)   Constitutional:  Well-developed, in no acute distress. Psychiatric: Normal mood and  affect. Behavior is normal. HEENT: Pupils normal.  Conjunctivae are normal. No scleral icterus. Cardiovascular: Normal rate, regular rhythm. No edema Pulmonary/chest: Effort normal and breath sounds normal. No wheezing, rales or rhonchi. Abdominal: Soft, nondistended. Nontender. Bowel sounds active throughout. There are no masses palpable. No hepatomegaly. Rectal: Deferred Neurological: Alert and oriented to person place and time. Skin: Skin is warm and dry. No rashes noted.    02/06/21, MD 04/22/2021, 11:32 AM  Cc: 04/24/2021, PA-C

## 2021-04-22 NOTE — Patient Instructions (Signed)
If you are age 71 or older, your body mass index should be between 23-30. Your Body mass index is 32.4 kg/m. If this is out of the aforementioned range listed, please consider follow up with your Primary Care Provider.  If you are age 56 or younger, your body mass index should be between 19-25. Your Body mass index is 32.4 kg/m. If this is out of the aformentioned range listed, please consider follow up with your Primary Care Provider.   __________________________________________________________  The [place] [facility] providers would like to encourage you to use [facility] to communicate with providers for non-urgent requests or questions.  Due to long hold times on the telephone, sending your provider a message by [facility] may be a faster and more efficient way to get a response.  Please allow 48 business hours for a response.  Please remember that this is for non-urgent requests.   Follow up as needed. Please call with any questions or concerns.  Thank you,  Dr. Lynann Bologna

## 2022-05-18 IMAGING — CT CT ABD-PELV W/ CM
2 of 4 series · 12 of 46 positions shown, 14 images · IV contrast (iopamidol)
Comparison: MR 01/11/2021, CT 01/15/2021

CLINICAL DATA: 69-year-old female with a history of hepatic
abscess, drainage 01/11/2021

EXAM:
CT ABDOMEN AND PELVIS WITH CONTRAST
TECHNIQUE: Multidetector CT imaging of the abdomen and pelvis was performed
using the standard protocol following bolus administration of
intravenous contrast.
CONTRAST:  100mL [id number] IOPAMIDOL ([id number]) INJECTION 61%

[Series 2: abd pelvis 5.00 br40 s3 axial · axial · 0.64mm/px · z∈[+1461,+1826]mm · 9 of 91 slices shown, 11 images]
[im 9/91  soft-tissue]
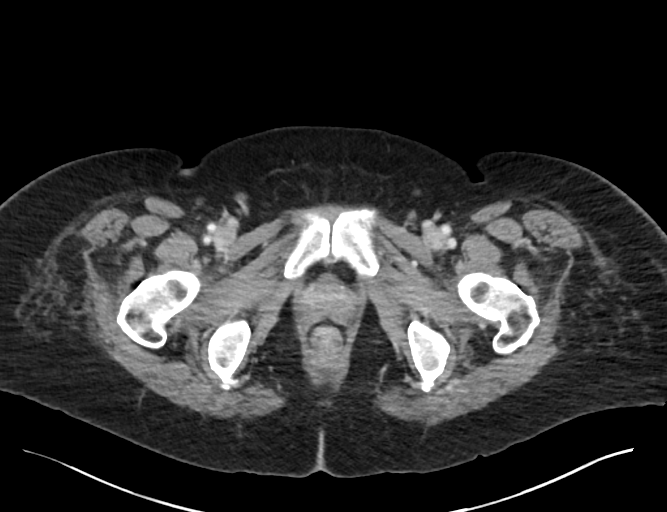
[im 9/91  bone]
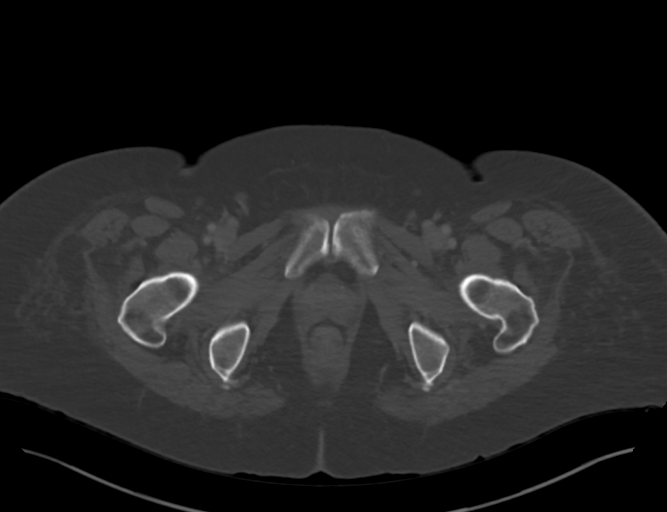
[im 17/91  soft-tissue]
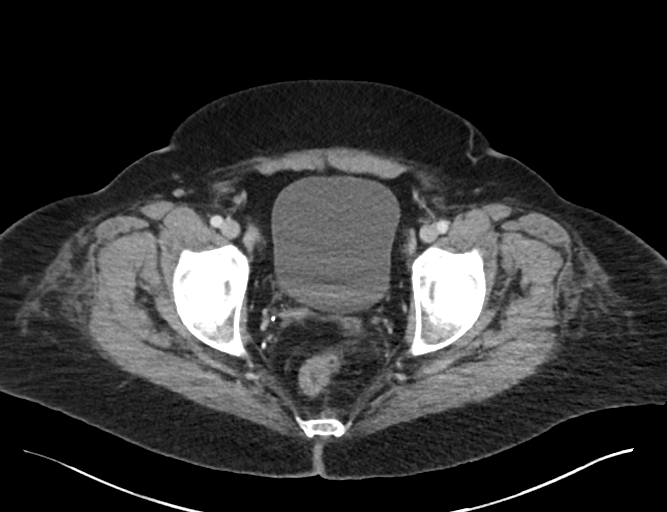
[im 25/91  soft-tissue]
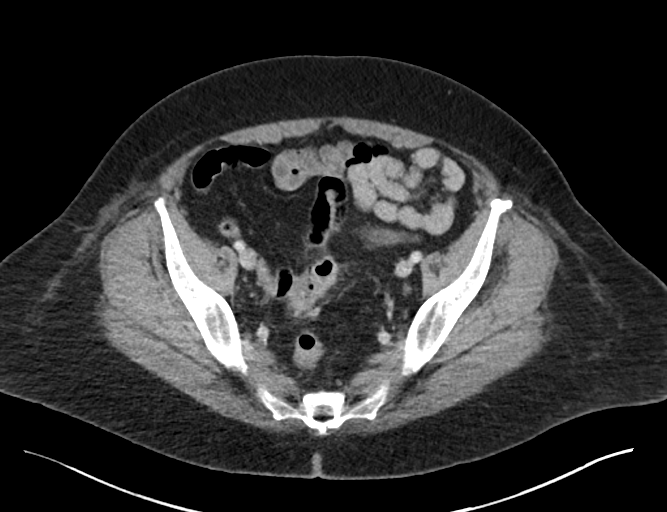
[im 37/91  soft-tissue]
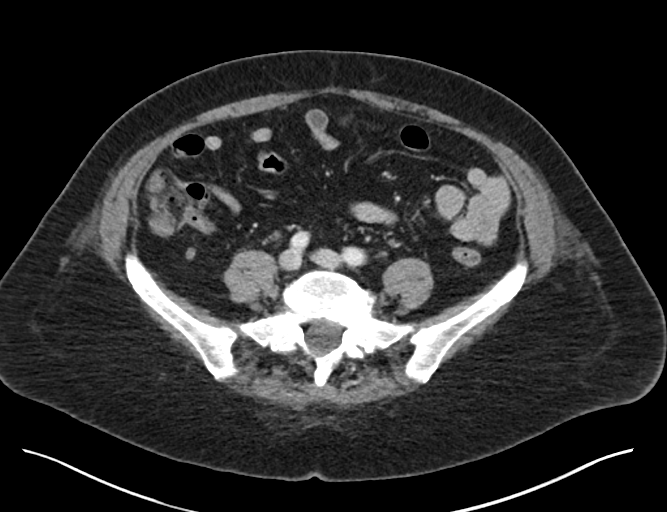
[im 46/91  soft-tissue]
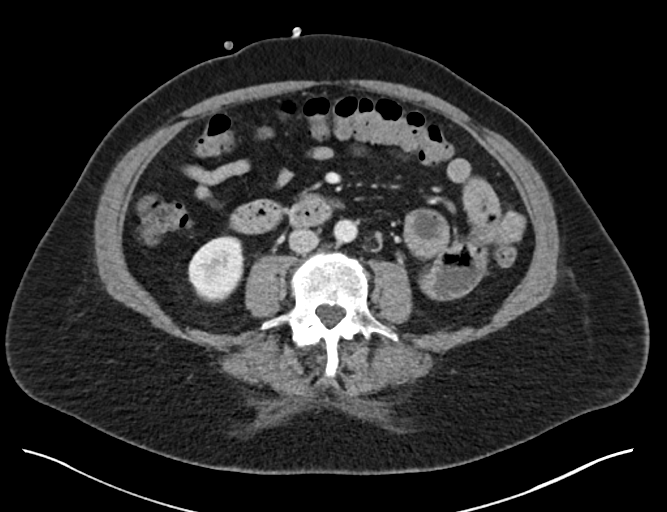
[im 54/91  soft-tissue]
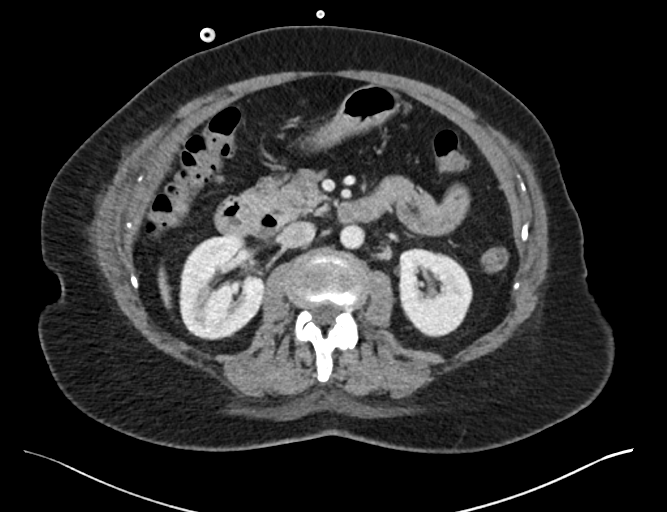
[im 66/91  soft-tissue]
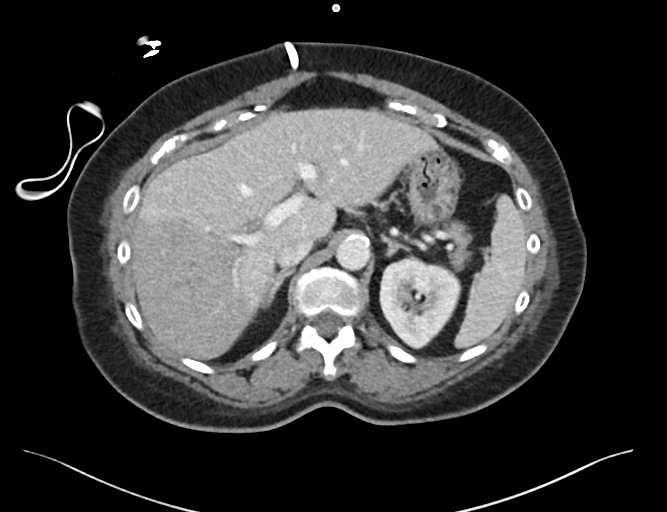
[im 74/91  soft-tissue]
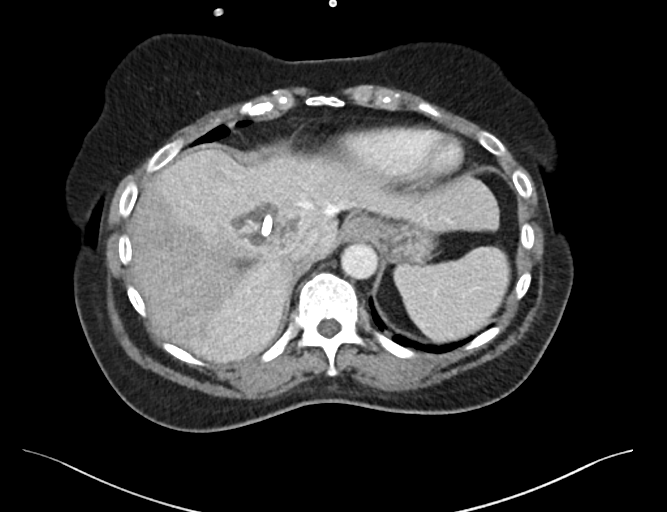
[im 82/91  soft-tissue]
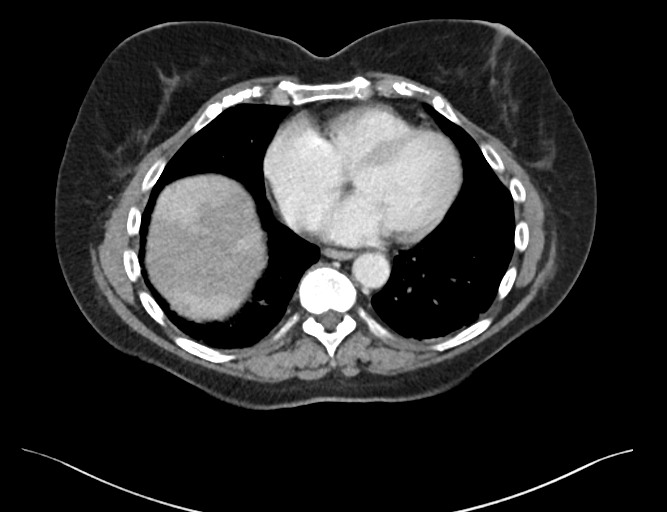
[im 82/91  bone]
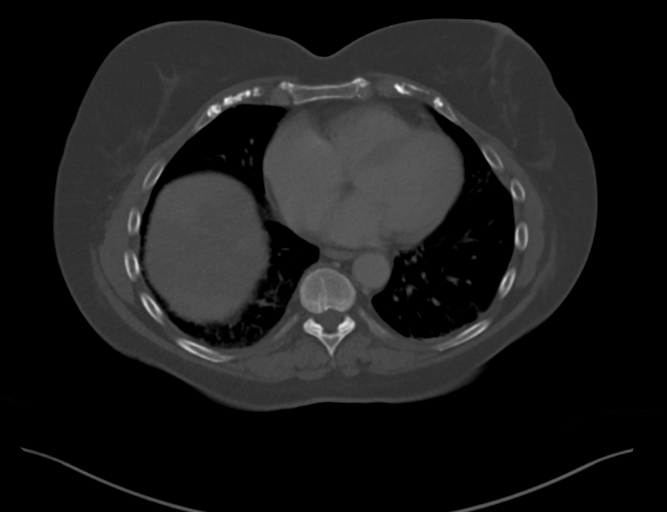

[Series 6: abd pelvis 2.00 br40 s3 cor · coronal · 0.83mm/px · 3 of 158 slices shown]
[im 53/158  soft-tissue]
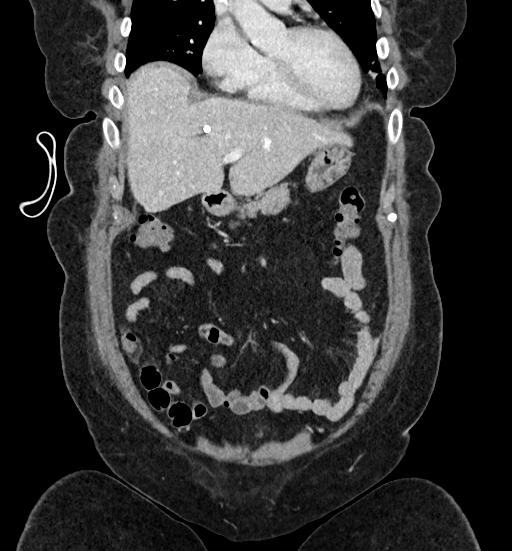
[im 70/158  soft-tissue]
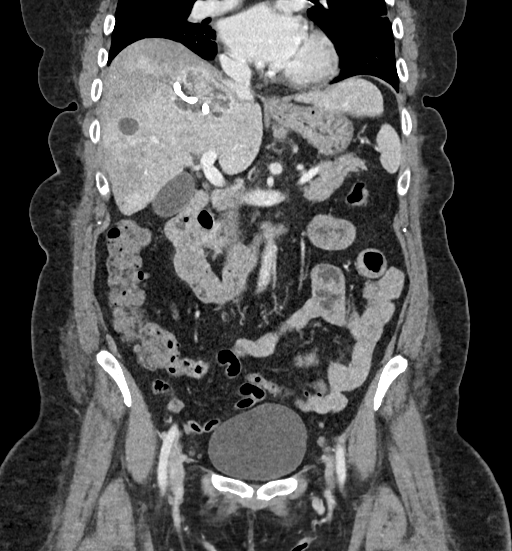
[im 88/158  soft-tissue]
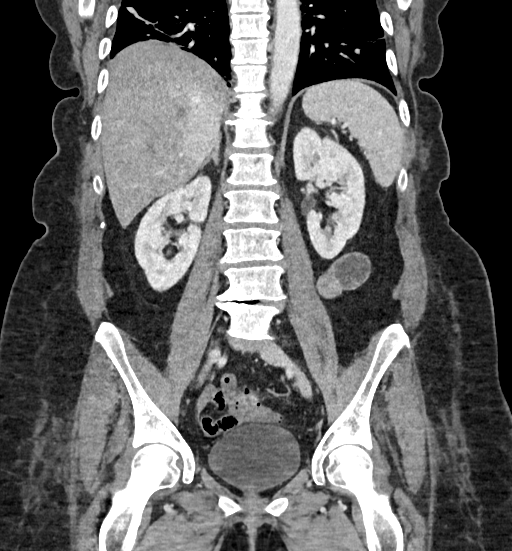

[12 of 46 positions shown; findings below may reference images not displayed]

FINDINGS: Lower chest: No acute finding of the lower lungs. Linear
atelectasis/scarring at the lung bases.

Hepatobiliary: Unchanged position of the pigtail drainage catheter
within the liver. There is persisting rim enhancing and centrally
low dense fluid collection persisting around the pigtail catheter
compatible with residual abscess. Internal linear enhancement
compatible with some internal complexity/septations. Estimated
dimensions 5.1 cm x 5.0 cm.

Small hypoenhancing/hypodense lesion in the right liver measures 19
mm, essentially unchanged. Small subcapsular focus in the left liver
is unchanged. Two additional lesions within the posterior right
liver on image 29 of series 2 and 23 of series 2 persist, with no
significant enlargement.

Wedge-shaped perfusion anomaly of the right liver, with evidence of
hepatic vein thrombosis or slow flow within right-sided hepatic
veins.

Unremarkable gallbladder

Pancreas: Unremarkable

Spleen: Unremarkable

Adrenals/Urinary Tract:

- Right adrenal gland:  Unremarkable

- Left adrenal gland: Unremarkable.

- Right kidney: No hydronephrosis, nephrolithiasis, inflammation, or
ureteral dilation. No focal lesion.

- Left Kidney: No hydronephrosis, nephrolithiasis, inflammation, or
ureteral dilation. No focal lesion.

- Urinary Bladder: Unremarkable.

Stomach/Bowel:

- Stomach: Unremarkable.

- Small bowel: Unremarkable

- Appendix: Normal.

- Colon: Colonic diverticula throughout the length of the colon.
Persisting trace inflammatory changes of the sigmoid colon, improved
from the comparison. No local lymphadenopathy/nodularity or evidence
of mass of the sigmoid colon.

Vascular/Lymphatic: Minimal atherosclerosis. Mesenteric and renal
arteries patent. Bilateral iliac and proximal femoral arteries
patent.

Systemic veins unremarkable.

No suspicious pelvic adenopathy. Small inguinal and iliac nodes
persist.

Reproductive: Hysterectomy

Other: No abdominal hernia.

Musculoskeletal: Degenerative changes of the visualized
thoracolumbar spine. Vacuum disc phenomenon of L4-L5 and L5-S1. No
bony canal narrowing. Degenerative changes of the hips.
IMPRESSION: Unchanged position of pigtail drainage catheter within the dominant
liver abscess. The size is relatively unchanged since the comparison
of 1 week prior. Similar size of the additional
hypodense/hypoenhancing foci of the bilateral liver, compatible with
additional sites of abscess.

The timing of the contrast bolus on the current CT suggests septic
thrombosis of the right-sided hepatic veins, contributing to the
perfusion defect of the right liver.

Diverticular disease of the sigmoid colon, potentially etiology of
the liver abscess.
